# Patient Record
Sex: Male | Born: 1965 | Race: White | Hispanic: No | Smoking: Current every day smoker
Health system: Southern US, Community
[De-identification: ages and names within clinical notes are randomized; demographics above are authoritative.]

## PROBLEM LIST (undated history)

## (undated) DIAGNOSIS — Z86010 Personal history of colonic polyps: Secondary | ICD-10-CM

## (undated) DIAGNOSIS — I10 Essential (primary) hypertension: Secondary | ICD-10-CM

## (undated) DIAGNOSIS — E119 Type 2 diabetes mellitus without complications: Secondary | ICD-10-CM

## (undated) HISTORY — DX: Essential (primary) hypertension: I10

## (undated) HISTORY — DX: Personal history of colonic polyps: Z86.010

## (undated) HISTORY — DX: Type 2 diabetes mellitus without complications: E11.9

---
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
## 2002-03-13 HISTORY — PX: KNEE ARTHROSCOPY: SUR90

## 2013-04-21 DIAGNOSIS — E119 Type 2 diabetes mellitus without complications: Secondary | ICD-10-CM | POA: Insufficient documentation

## 2013-05-06 ENCOUNTER — Ambulatory Visit: Payer: Self-pay | Admitting: Internal Medicine

## 2013-05-11 ENCOUNTER — Ambulatory Visit: Payer: Self-pay | Admitting: Internal Medicine

## 2014-02-20 ENCOUNTER — Encounter: Payer: Self-pay | Admitting: Internal Medicine

## 2014-04-10 ENCOUNTER — Ambulatory Visit (AMBULATORY_SURGERY_CENTER): Payer: Self-pay

## 2014-04-10 VITALS — Ht 71.0 in | Wt 244.4 lb

## 2014-04-10 DIAGNOSIS — Z1211 Encounter for screening for malignant neoplasm of colon: Secondary | ICD-10-CM

## 2014-04-10 NOTE — Progress Notes (Signed)
No allergies to eggs or soy No past problems with anesthesia No home oxygen No diet/weight loss meds  No email  Emmi instructions given for colonoscopy

## 2014-04-13 LAB — HM COLONOSCOPY

## 2014-04-24 ENCOUNTER — Ambulatory Visit (AMBULATORY_SURGERY_CENTER): Payer: BLUE CROSS/BLUE SHIELD | Admitting: Internal Medicine

## 2014-04-24 ENCOUNTER — Encounter: Payer: Self-pay | Admitting: Internal Medicine

## 2014-04-24 VITALS — BP 111/91 | HR 65 | Temp 98.1°F | Resp 18 | Ht 71.0 in | Wt 244.0 lb

## 2014-04-24 DIAGNOSIS — D123 Benign neoplasm of transverse colon: Secondary | ICD-10-CM

## 2014-04-24 DIAGNOSIS — Z1211 Encounter for screening for malignant neoplasm of colon: Secondary | ICD-10-CM

## 2014-04-24 HISTORY — PX: COLONOSCOPY: SHX174

## 2014-04-24 MED ORDER — SODIUM CHLORIDE 0.9 % IV SOLN: 500.0000 mL | INTRAVENOUS | Status: DC

## 2014-04-24 NOTE — Progress Notes (Signed)
Called to room to assist during endoscopic procedure.  Patient ID and intended procedure confirmed with present staff. Received instructions for my participation in the procedure from the performing physician.  

## 2014-04-24 NOTE — Patient Instructions (Addendum)
I found and removed 2 small polyps that look benign.  I will let you know pathology results and when to have another routine colonoscopy by mail.  I appreciate the opportunity to care for you. Gatha Mayer, MD, FACG   YOU HAD AN ENDOSCOPIC PROCEDURE TODAY AT [facility]: Refer to the procedure report that was given to you for any specific questions about what was found during the examination.  If the procedure report does not answer your questions, please call your gastroenterologist to clarify.  If you requested that your care partner not be given the details of your procedure findings, then the procedure report has been included in a sealed envelope for you to review at your convenience later.  YOU SHOULD EXPECT: Some feelings of bloating in the abdomen. Passage of more gas than usual.  Walking can help get rid of the air that was put into your GI tract during the procedure and reduce the bloating. If you had a lower endoscopy (such as a colonoscopy or flexible sigmoidoscopy) you may notice spotting of blood in your stool or on the toilet paper. If you underwent a bowel prep for your procedure, then you may not have a normal bowel movement for a few days.  DIET: Your first meal following the procedure should be a light meal and then it is ok to progress to your normal diet.  A half-sandwich or bowl of soup is an example of a good first meal.  Heavy or fried foods are harder to digest and may make you feel nauseous or bloated.  Likewise meals heavy in dairy and vegetables can cause extra gas to form and this can also increase the bloating.  Drink plenty of fluids but you should avoid alcoholic beverages for 24 hours.  ACTIVITY: Your care partner should take you home directly after the procedure.  You should plan to take it easy, moving slowly for the rest of the day.  You can resume normal activity the day after the procedure however you should NOT DRIVE or use heavy  machinery for 24 hours (because of the sedation medicines used during the test).    SYMPTOMS TO REPORT IMMEDIATELY: A gastroenterologist can be reached at any hour.  During normal business hours, 8:30 AM to 5:00 PM Monday through Friday, call [phone].  After hours and on weekends, please call the GI answering service at [phone] who will take a message and have the physician on call contact you.   Following lower endoscopy (colonoscopy or flexible sigmoidoscopy):  Excessive amounts of blood in the stool  Significant tenderness or worsening of abdominal pains  Swelling of the abdomen that is new, acute  Fever of 100F or higher  FOLLOW UP: If any biopsies were taken you will be contacted by phone or by letter within the next 1-3 weeks.  Call your gastroenterologist if you have not heard about the biopsies in 3 weeks.  Our staff will call the home number listed on your records the next business day following your procedure to check on you and address any questions or concerns that you may have at that time regarding the information given to you following your procedure. This is a courtesy call and so if there is no answer at the home number and we have not heard from you through the emergency physician on call, we will assume that you have returned to your regular daily activities without incident.  SIGNATURES/CONFIDENTIALITY: You and/or your care  partner have signed paperwork which will be entered into your electronic medical record.  These signatures attest to the fact that that the information above on your After Visit Summary has been reviewed and is understood.  Full responsibility of the confidentiality of this discharge information lies with you and/or your care-partner.  Read all of the handouts given to you by your recovery room nurse.

## 2014-04-24 NOTE — Progress Notes (Signed)
Procedure ends, to recovery, report to Ernestine Conrad, RN. VSS

## 2014-04-24 NOTE — Op Note (Signed)
[employer]  [address] [place], [id number]   COLONOSCOPY PROCEDURE REPORT  PATIENT: Benjamin, Nelson  MR#: [id number] BIRTHDATE: 1965-03-16 , 48  yrs. old GENDER: male ENDOSCOPIST: Gatha Mayer, MD, [facility] PROCEDURE DATE:  04/24/2014 PROCEDURE:   Colonoscopy with snare polypectomy First Screening Colonoscopy - Avg.  risk and is 50 yrs.  old or older Yes.  Prior Negative Screening - Now for repeat screening. N/A  History of Adenoma - Now for follow-up colonoscopy & has been > or = to 3 yrs.  N/A  Polyps Removed Today? Yes. ASA CLASS:   Class II INDICATIONS:average risk patient for colorectal cancer. MEDICATIONS: Propofol 300 mg IV and Monitored anesthesia care  DESCRIPTION OF PROCEDURE:   After the risks benefits and alternatives of the procedure were thoroughly explained, informed consent was obtained.  The digital rectal exam revealed no abnormalities of the rectum, revealed no prostatic nodules, and revealed the prostate was not enlarged.   The [id number] endoscope was introduced through the anus and advanced to the cecum, which was identified by both the appendix and ileocecal valve. No adverse events experienced.   The quality of the prep was good, using MiraLax  The instrument was then slowly withdrawn as the colon was fully examined.      COLON FINDINGS: 1) Two diminutive transverse colon polyps removed with cold snare and sent to pathology. 2) Otherwise normal colonoscopy with good prep - first screening.  Retroflexed right colon and rectal views revealed no abnormalities. The time to cecum=2 minutes 02 seconds.  Withdrawal time=12 minutes 09 seconds. The scope was withdrawn and the procedure completed. COMPLICATIONS: There were no immediate complications.  ENDOSCOPIC IMPRESSION: 1) Two diminutive transverse colon polyps removed with cold snare and sent to pathology. 2) Otherwise normal colonoscopy with good prep - first  screening  RECOMMENDATIONS: Timing of repeat colonoscopy will be determined by pathology findings.  eSigned:  Gatha Mayer, MD, [facility] 04/24/2014 9:37 AM   cc: Halina Maidens, MD and The Patient

## 2014-04-27 ENCOUNTER — Telehealth: Payer: Self-pay | Admitting: *Deleted

## 2014-04-27 NOTE — Telephone Encounter (Signed)
  Follow up Call-  Call back number 04/24/2014  Post procedure Call Back phone  # [phone]  Permission to leave phone message Yes     No answer at # given.  Left message on VM.

## 2014-04-30 ENCOUNTER — Encounter: Payer: Self-pay | Admitting: Internal Medicine

## 2014-04-30 DIAGNOSIS — Z860100 Personal history of colon polyps, unspecified: Secondary | ICD-10-CM | POA: Insufficient documentation

## 2014-04-30 HISTORY — DX: Personal history of colonic polyps: Z86.010

## 2014-04-30 NOTE — Progress Notes (Signed)
Quick Note:  2 diminutive ssp's repeat colonoscopy 2021 ______

## 2014-08-05 LAB — LIPID PANEL
CHOLESTEROL: 200 mg/dL (ref 0–200)
HDL: 31 mg/dL — AB (ref 35–70)
LDL Cholesterol: 108 mg/dL
Triglycerides: 307 mg/dL — AB (ref 40–160)

## 2014-08-05 LAB — PSA: PSA: 1

## 2014-11-28 ENCOUNTER — Other Ambulatory Visit: Payer: Self-pay | Admitting: Internal Medicine

## 2014-12-29 ENCOUNTER — Encounter: Payer: Self-pay | Admitting: Internal Medicine

## 2014-12-29 ENCOUNTER — Other Ambulatory Visit: Payer: Self-pay | Admitting: Internal Medicine

## 2014-12-29 DIAGNOSIS — R7989 Other specified abnormal findings of blood chemistry: Secondary | ICD-10-CM | POA: Insufficient documentation

## 2014-12-29 DIAGNOSIS — R6882 Decreased libido: Secondary | ICD-10-CM | POA: Insufficient documentation

## 2014-12-29 DIAGNOSIS — I10 Essential (primary) hypertension: Secondary | ICD-10-CM | POA: Insufficient documentation

## 2014-12-30 ENCOUNTER — Encounter: Payer: Self-pay | Admitting: Internal Medicine

## 2014-12-30 ENCOUNTER — Ambulatory Visit (INDEPENDENT_AMBULATORY_CARE_PROVIDER_SITE_OTHER): Payer: BLUE CROSS/BLUE SHIELD | Admitting: Internal Medicine

## 2014-12-30 VITALS — BP 110/76 | HR 76 | Ht 71.0 in | Wt 242.2 lb

## 2014-12-30 DIAGNOSIS — Z23 Encounter for immunization: Secondary | ICD-10-CM | POA: Diagnosis not present

## 2014-12-30 DIAGNOSIS — I10 Essential (primary) hypertension: Secondary | ICD-10-CM

## 2014-12-30 DIAGNOSIS — E1169 Type 2 diabetes mellitus with other specified complication: Secondary | ICD-10-CM | POA: Diagnosis not present

## 2014-12-30 DIAGNOSIS — R7989 Other specified abnormal findings of blood chemistry: Secondary | ICD-10-CM

## 2014-12-30 DIAGNOSIS — E119 Type 2 diabetes mellitus without complications: Secondary | ICD-10-CM | POA: Diagnosis not present

## 2014-12-30 DIAGNOSIS — E291 Testicular hypofunction: Secondary | ICD-10-CM

## 2014-12-30 DIAGNOSIS — E785 Hyperlipidemia, unspecified: Secondary | ICD-10-CM

## 2014-12-30 MED ORDER — TESTOSTERONE 20.25 MG/ACT (1.62%) TD GEL: 4.0000 "application " | Freq: Every day | TRANSDERMAL | Status: DC

## 2014-12-30 NOTE — Progress Notes (Signed)
Date:  12/30/2014   Name:  Benjamin Nelson   DOB:  10/16/1965   MRN:  [id number]   Chief Complaint: Diabetes and Hyperlipidemia Diabetes He presents for his follow-up diabetic visit. He has type 2 diabetes mellitus. His disease course has been fluctuating. Pertinent negatives for hypoglycemia include no headaches. Pertinent negatives for diabetes include no chest pain and no fatigue. His home blood glucose trend is fluctuating dramatically. His breakfast blood glucose is taken between 7-8 am. His breakfast blood glucose range is generally 90-110 mg/dl. His dinner blood glucose is taken between 5-6 pm. His dinner blood glucose range is generally 140-180 mg/dl.  Hyperlipidemia This is a chronic problem. The current episode started more than 1 year ago. The problem is controlled. Recent lipid tests were reviewed and are normal. Exacerbating diseases include diabetes. Pertinent negatives include no chest pain or shortness of breath. Current antihyperlipidemic treatment includes statins. The current treatment provides significant improvement of lipids. There are no compliance problems.   Hypertension This is a chronic problem. The current episode started more than 1 year ago. The problem is unchanged. The problem is controlled. Pertinent negatives include no chest pain, headaches, palpitations or shortness of breath. Risk factors for coronary artery disease include diabetes mellitus and dyslipidemia. Past treatments include angiotensin blockers and diuretics. The current treatment provides significant improvement. There are no compliance problems.   Hypogonadism - patient was started on AndroGel about 8 months ago. Initially 2 applications per day and after the last visit increased to 4 applications per day for a level of 285.  He believes his energy level and libido are slightly improved but he could still feel better.   He has no issues with compliance and no local skin irritation.   Review of Systems   Constitutional: Negative for fever, appetite change and fatigue.  Eyes: Negative for visual disturbance.  Respiratory: Negative for chest tightness and shortness of breath.   Cardiovascular: Negative for chest pain, palpitations and leg swelling.  Gastrointestinal: Positive for diarrhea (intermittent). Negative for abdominal pain.  Genitourinary: Negative for difficulty urinating.  Skin: Negative for color change and rash.  Neurological: Negative for numbness and headaches.  Hematological: Negative for adenopathy.  Psychiatric/Behavioral: Negative for sleep disturbance and dysphoric mood.    Patient Active Problem List   Diagnosis Date Noted  . Essential (primary) hypertension 12/29/2014  . Decreased libido 12/29/2014  . Decreased testosterone level 12/29/2014  . Hx of colonic polyps - sessile serrated 04/30/2014  . Controlled diabetes mellitus type II without complication ([place]) 87/86/7672    Prior to Admission medications   Medication Sig Start Date End Date Taking? Authorizing Provider  atorvastatin (LIPITOR) 10 MG tablet Take 1 tablet by mouth daily. 08/05/14  Yes Historical Provider, MD  glucose blood (ONE TOUCH ULTRA TEST) test strip  05/14/13  Yes Historical Provider, MD  loratadine (CLARITIN) 10 MG tablet Take 10 mg by mouth daily.   Yes Historical Provider, MD  metFORMIN (GLUCOPHAGE-XR) 500 MG 24 hr tablet TAKE 1 TABLET EVERY DAY WITH FOOD 11/29/14  Yes Glean Hess, MD  Testosterone (ANDROGEL PUMP) 20.25 MG/ACT (1.62%) GEL Place 2 application onto the skin daily. 08/05/14  Yes Historical Provider, MD  valsartan (DIOVAN) 320 MG tablet Take 320 mg by mouth daily.   Yes Historical Provider, MD    No Known Allergies  Past Surgical History  Procedure Laterality Date  . Tonsillectomy    . Knee arthroscopy      left torn meniscus  Social History  Substance Use Topics  . Smoking status: Former Research scientist (life sciences)  . Smokeless tobacco: Never Used  . Alcohol Use: 1.8 oz/week    3  Glasses of wine per week     Medication list has been reviewed and updated.   Physical Exam  Constitutional: He is oriented to person, place, and time. He appears well-developed and well-nourished. No distress.  HENT:  Head: Normocephalic and atraumatic.  Eyes: Conjunctivae are normal. Right eye exhibits no discharge. Left eye exhibits no discharge. No scleral icterus.  Neck: Normal range of motion. Neck supple. No thyromegaly present.  Cardiovascular: Normal rate, regular rhythm and normal heart sounds.   Pulmonary/Chest: Effort normal and breath sounds normal. No respiratory distress.  Abdominal: Soft. Bowel sounds are normal. He exhibits no mass. There is no tenderness. There is no guarding.  Musculoskeletal: Normal range of motion. He exhibits no edema or tenderness.  Neurological: He is alert and oriented to person, place, and time.  Skin: Skin is warm and dry. No rash noted.  Psychiatric: He has a normal mood and affect. His behavior is normal. Thought content normal.  Nursing note and vitals reviewed.   BP 110/76 mmHg  Pulse 76  Ht 5\' 11"  (1.803 m)  Wt 242 lb 3.2 oz (109.861 kg)  BMI 33.79 kg/m2   Assessment and Plan: 1. Flu vaccine need - Flu Vaccine QUAD 36+ mos PF IM (Fluarix & Fluzone Quad PF)  2. Controlled type 2 diabetes mellitus without complication, without long-term current use of insulin (HCC) Fluctuating glucoses due to inconsistent dosing times for metformin He will work harder to take metformin every morning - Hemoglobin A1c - Comprehensive metabolic panel  3. Essential (primary) hypertension Controlled on medication - Comprehensive metabolic panel  4. Decreased testosterone level Now on 4 pump applications per day Patient advised to goal level of testosterone is 500 - Testosterone - Testosterone (ANDROGEL PUMP) 20.25 MG/ACT (1.62%) GEL; Place 4 application onto the skin daily.  Dispense: 75 g; Refill: 5  5. Hyperlipidemia associated with type 2  diabetes mellitus ([place]) Now on statin therapy without side effects CMP ordered  Halina Maidens, MD [facility]  12/30/2014

## 2014-12-31 LAB — COMPREHENSIVE METABOLIC PANEL
ALBUMIN: 4.3 g/dL (ref 3.5–5.5)
ALK PHOS: 85 IU/L (ref 39–117)
ALT: 35 IU/L (ref 0–44)
AST: 22 IU/L (ref 0–40)
Albumin/Globulin Ratio: 1.7 (ref 1.1–2.5)
BILIRUBIN TOTAL: 0.5 mg/dL (ref 0.0–1.2)
BUN / CREAT RATIO: 12 (ref 9–20)
BUN: 13 mg/dL (ref 6–24)
CHLORIDE: 97 mmol/L (ref 97–106)
CO2: 25 mmol/L (ref 18–29)
Calcium: 9.5 mg/dL (ref 8.7–10.2)
Creatinine, Ser: 1.06 mg/dL (ref 0.76–1.27)
GFR calc Af Amer: 93 mL/min/{1.73_m2} (ref >59)
GFR calc non Af Amer: 81 mL/min/{1.73_m2} (ref >59)
GLOBULIN, TOTAL: 2.5 g/dL (ref 1.5–4.5)
GLUCOSE: 136 mg/dL — AB (ref 65–99)
Potassium: 4.2 mmol/L (ref 3.5–5.2)
SODIUM: 138 mmol/L (ref 136–144)
Total Protein: 6.8 g/dL (ref 6.0–8.5)

## 2014-12-31 LAB — TESTOSTERONE: TESTOSTERONE: 381 ng/dL (ref 348–1197)

## 2014-12-31 LAB — HEMOGLOBIN A1C
ESTIMATED AVERAGE GLUCOSE: 171 mg/dL
HEMOGLOBIN A1C: 7.6 % — AB (ref 4.8–5.6)

## 2015-01-01 ENCOUNTER — Ambulatory Visit: Payer: Self-pay | Admitting: Internal Medicine

## 2015-01-18 ENCOUNTER — Other Ambulatory Visit: Payer: Self-pay

## 2015-01-18 MED ORDER — METFORMIN HCL ER 500 MG PO TB24: 500.0000 mg | ORAL_TABLET | Freq: Two times a day (BID) | ORAL | Status: DC

## 2015-01-31 ENCOUNTER — Other Ambulatory Visit: Payer: Self-pay | Admitting: Internal Medicine

## 2015-02-16 ENCOUNTER — Other Ambulatory Visit: Payer: Self-pay

## 2015-02-16 DIAGNOSIS — R7989 Other specified abnormal findings of blood chemistry: Secondary | ICD-10-CM

## 2015-02-16 MED ORDER — TESTOSTERONE 20.25 MG/ACT (1.62%) TD GEL: 4.0000 "application " | Freq: Every day | TRANSDERMAL | Status: DC

## 2015-03-17 ENCOUNTER — Other Ambulatory Visit: Payer: Self-pay | Admitting: Internal Medicine

## 2015-03-17 ENCOUNTER — Telehealth: Payer: Self-pay

## 2015-03-17 DIAGNOSIS — R7989 Other specified abnormal findings of blood chemistry: Secondary | ICD-10-CM

## 2015-03-17 MED ORDER — ATORVASTATIN CALCIUM 10 MG PO TABS: 10.0000 mg | ORAL_TABLET | Freq: Every day | ORAL | Status: DC

## 2015-03-17 NOTE — Telephone Encounter (Signed)
Patient called in today. States that he does not have any refills left on his Atorvastatin and also only received enough for a 15 day supply. Patient changed his pharmacy to [facility], which I have updated in the computer.

## 2015-03-18 ENCOUNTER — Other Ambulatory Visit: Payer: Self-pay | Admitting: Internal Medicine

## 2015-03-18 DIAGNOSIS — R7989 Other specified abnormal findings of blood chemistry: Secondary | ICD-10-CM

## 2015-03-18 MED ORDER — TESTOSTERONE 20.25 MG/ACT (1.62%) TD GEL: 4.0000 "application " | Freq: Every day | TRANSDERMAL | Status: DC

## 2015-04-08 ENCOUNTER — Other Ambulatory Visit: Payer: Self-pay | Admitting: Internal Medicine

## 2015-05-04 ENCOUNTER — Ambulatory Visit (INDEPENDENT_AMBULATORY_CARE_PROVIDER_SITE_OTHER): Payer: BLUE CROSS/BLUE SHIELD | Admitting: Internal Medicine

## 2015-05-04 ENCOUNTER — Encounter: Payer: Self-pay | Admitting: Internal Medicine

## 2015-05-04 VITALS — BP 122/76 | HR 76 | Ht 71.0 in | Wt 248.0 lb

## 2015-05-04 DIAGNOSIS — R7989 Other specified abnormal findings of blood chemistry: Secondary | ICD-10-CM

## 2015-05-04 DIAGNOSIS — I10 Essential (primary) hypertension: Secondary | ICD-10-CM | POA: Diagnosis not present

## 2015-05-04 DIAGNOSIS — E291 Testicular hypofunction: Secondary | ICD-10-CM | POA: Diagnosis not present

## 2015-05-04 DIAGNOSIS — E119 Type 2 diabetes mellitus without complications: Secondary | ICD-10-CM

## 2015-05-04 DIAGNOSIS — E118 Type 2 diabetes mellitus with unspecified complications: Secondary | ICD-10-CM | POA: Insufficient documentation

## 2015-05-04 MED ORDER — VALSARTAN 320 MG PO TABS: 320.0000 mg | ORAL_TABLET | Freq: Every day | ORAL | Status: DC

## 2015-05-04 NOTE — Progress Notes (Signed)
Date:  05/04/2015   Name:  Benjamin Nelson   DOB:  [birth year]   MRN:  [id number]   Chief Complaint: Follow-up; Diabetes; and Hypertension Diabetes He presents for his follow-up diabetic visit. He has type 2 diabetes mellitus. Pertinent negatives for hypoglycemia include no headaches or tremors. Pertinent negatives for diabetes include no chest pain, no fatigue, no polydipsia and no polyuria. Pertinent negatives for hypoglycemia complications include no nocturnal hypoglycemia and no required assistance. Symptoms are worsening. Current diabetic treatment includes oral agent (monotherapy) (instructed to increase metformin to bid).  Hypertension This is a chronic problem. The problem is unchanged. The problem is controlled. Pertinent negatives include no chest pain, headaches, palpitations or shortness of breath. Past treatments include angiotensin blockers. The current treatment provides significant improvement.   Low testosterone - patient reports doing well on testosterone replacement. He is using 4 pump applications per day. He has no side effects. He has better energy better concentration and better sleep. He denies any prostate obstructive symptoms.  Lab Results  Component Value Date   HGBA1C 7.6* 12/30/2014   Lab Results  Component Value Date   TESTOSTERONE 381 12/30/2014      Review of Systems  Constitutional: Negative for appetite change, fatigue and unexpected weight change.  Eyes: Negative for visual disturbance.  Respiratory: Negative for cough, shortness of breath and wheezing.   Cardiovascular: Negative for chest pain, palpitations and leg swelling.  Gastrointestinal: Negative for abdominal pain, diarrhea, constipation and blood in stool.  Endocrine: Negative for polydipsia and polyuria.  Genitourinary: Positive for difficulty urinating. Negative for dysuria, urgency, frequency and hematuria.  Skin: Negative for rash.  Neurological: Negative for tremors, numbness and headaches.     Patient Active Problem List   Diagnosis Date Noted  . Hyperlipidemia associated with type 2 diabetes mellitus ([place]) 12/30/2014  . Essential (primary) hypertension 12/29/2014  . Decreased libido 12/29/2014  . Decreased testosterone level 12/29/2014  . Hx of colonic polyps - sessile serrated 04/30/2014  . Controlled diabetes mellitus type II without complication ([place]) [id number]    Prior to Admission medications   Medication Sig Start Date End Date Taking? Authorizing Provider  atorvastatin (LIPITOR) 10 MG tablet Take 1 tablet (10 mg total) by mouth daily. 03/17/15  Yes Glean Hess, MD  glucose blood (ONE TOUCH ULTRA TEST) test strip  05/14/13  Yes Historical Provider, MD  loratadine (CLARITIN) 10 MG tablet Take 10 mg by mouth daily.   Yes Historical Provider, MD  metFORMIN (GLUCOPHAGE-XR) 500 MG 24 hr tablet TAKE 1 TABLET BY MOUTH EVERY DAY 01/31/15  Yes Glean Hess, MD  Testosterone (ANDROGEL PUMP) 20.25 MG/ACT (1.62%) GEL Place 4 application onto the skin daily. 03/18/15  Yes Glean Hess, MD  valsartan (DIOVAN) 320 MG tablet TAKE 1 TABLET BY MOUTH ONCE DAILY. 04/08/15  Yes Glean Hess, MD    No Known Allergies  Past Surgical History  Procedure Laterality Date  . Tonsillectomy    . Knee arthroscopy      left torn meniscus    Social History  Substance Use Topics  . Smoking status: Former Research scientist (life sciences)  . Smokeless tobacco: Never Used  . Alcohol Use: 1.8 oz/week    3 Glasses of wine per week     Comment: occasional     Medication list has been reviewed and updated.   Physical Exam  Constitutional: He is oriented to person, place, and time. He appears well-developed and well-nourished. No distress.  HENT:  Head: Normocephalic  and atraumatic.  Neck: Normal range of motion. Neck supple. Carotid bruit is not present. No thyromegaly present.  Cardiovascular: Normal rate, regular rhythm and normal heart sounds.   Pulmonary/Chest: Effort normal and breath sounds  normal. No respiratory distress.  Musculoskeletal: He exhibits no edema.  Neurological: He is alert and oriented to person, place, and time.  Skin: Skin is warm and dry. No rash noted.  Psychiatric: He has a normal mood and affect. His behavior is normal. Thought content normal.    BP 122/76 mmHg  Pulse 76  Ht 5\' 11"  (1.803 m)  Wt 248 lb (112.492 kg)  BMI 34.60 kg/m2  Assessment and Plan: 1. Essential (primary) hypertension controlled - valsartan (DIOVAN) 320 MG tablet; Take 1 tablet (320 mg total) by mouth daily.  Dispense: 30 tablet; Refill: 5  2. Controlled type 2 diabetes mellitus without complication, without long-term current use of insulin (HCC) Continue bid metformin - Lipid panel - Hemoglobin A1c  3. Decreased testosterone level Continue testosterone topical 4 pumps per day - PSA   Halina Maidens, MD [facility]  05/04/2015

## 2015-05-05 ENCOUNTER — Telehealth: Payer: Self-pay

## 2015-05-05 LAB — LIPID PANEL
Chol/HDL Ratio: 3.5 ratio units (ref 0.0–5.0)
Cholesterol, Total: 134 mg/dL (ref 100–199)
HDL: 38 mg/dL — AB (ref >39)
LDL Calculated: 72 mg/dL (ref 0–99)
Triglycerides: 121 mg/dL (ref 0–149)
VLDL Cholesterol Cal: 24 mg/dL (ref 5–40)

## 2015-05-05 LAB — HEMOGLOBIN A1C
ESTIMATED AVERAGE GLUCOSE: 166 mg/dL
HEMOGLOBIN A1C: 7.4 % — AB (ref 4.8–5.6)

## 2015-05-05 LAB — PSA: PROSTATE SPECIFIC AG, SERUM: 0.7 ng/mL (ref 0.0–4.0)

## 2015-05-05 NOTE — Telephone Encounter (Signed)
Left message for patient to call back  

## 2015-05-05 NOTE — Telephone Encounter (Signed)
-----   Message from Glean Hess, MD sent at 05/05/2015  7:51 AM EST ----- DM is better.  PSA and cholesterol remain normal.

## 2015-05-06 NOTE — Telephone Encounter (Signed)
Spoke with patient. Patient advised of all results and verbalized understanding. Will call back with any future questions or concerns. MAH  

## 2015-08-05 ENCOUNTER — Other Ambulatory Visit: Payer: Self-pay | Admitting: Internal Medicine

## 2015-08-17 ENCOUNTER — Encounter: Payer: BLUE CROSS/BLUE SHIELD | Admitting: Internal Medicine

## 2015-09-10 ENCOUNTER — Other Ambulatory Visit: Payer: Self-pay | Admitting: Internal Medicine

## 2015-09-16 NOTE — Telephone Encounter (Signed)
Patient scheduled DM follow up on 10/06/15 at 9 am

## 2015-09-21 ENCOUNTER — Ambulatory Visit (INDEPENDENT_AMBULATORY_CARE_PROVIDER_SITE_OTHER): Payer: BLUE CROSS/BLUE SHIELD | Admitting: Internal Medicine

## 2015-09-21 ENCOUNTER — Encounter: Payer: Self-pay | Admitting: Internal Medicine

## 2015-09-21 VITALS — BP 115/68 | HR 80 | Resp 16 | Ht 71.0 in | Wt 244.0 lb

## 2015-09-21 DIAGNOSIS — Z23 Encounter for immunization: Secondary | ICD-10-CM | POA: Diagnosis not present

## 2015-09-21 DIAGNOSIS — E119 Type 2 diabetes mellitus without complications: Secondary | ICD-10-CM

## 2015-09-21 DIAGNOSIS — Z0000 Encounter for general adult medical examination without abnormal findings: Secondary | ICD-10-CM | POA: Diagnosis not present

## 2015-09-21 DIAGNOSIS — E1169 Type 2 diabetes mellitus with other specified complication: Secondary | ICD-10-CM | POA: Diagnosis not present

## 2015-09-21 DIAGNOSIS — I10 Essential (primary) hypertension: Secondary | ICD-10-CM | POA: Diagnosis not present

## 2015-09-21 DIAGNOSIS — Z1159 Encounter for screening for other viral diseases: Secondary | ICD-10-CM

## 2015-09-21 DIAGNOSIS — E291 Testicular hypofunction: Secondary | ICD-10-CM | POA: Diagnosis not present

## 2015-09-21 DIAGNOSIS — E785 Hyperlipidemia, unspecified: Secondary | ICD-10-CM | POA: Diagnosis not present

## 2015-09-21 DIAGNOSIS — R7989 Other specified abnormal findings of blood chemistry: Secondary | ICD-10-CM

## 2015-09-21 DIAGNOSIS — J3089 Other allergic rhinitis: Secondary | ICD-10-CM | POA: Insufficient documentation

## 2015-09-21 MED ORDER — VALSARTAN 320 MG PO TABS: 320.0000 mg | ORAL_TABLET | Freq: Every day | ORAL | Status: DC

## 2015-09-21 MED ORDER — TESTOSTERONE 20.25 MG/ACT (1.62%) TD GEL: 4.0000 "application " | Freq: Every day | TRANSDERMAL | Status: DC

## 2015-09-21 MED ORDER — METFORMIN HCL ER 500 MG PO TB24: 500.0000 mg | ORAL_TABLET | Freq: Two times a day (BID) | ORAL | Status: DC

## 2015-09-21 NOTE — Progress Notes (Signed)
Date:  09/21/2015   Name:  Benjamin Nelson   DOB:  Mar 15, 1965   MRN:  [id number]   Chief Complaint: Annual Exam; Hypertension; and Diabetes [name] is a 50 y.o. male who presents today for his Complete Annual Exam. He feels well. He reports exercising walking. He reports he is sleeping well.   Hypertension This is a chronic problem. The current episode started more than 1 year ago. The problem is unchanged. The problem is controlled. Pertinent negatives include no chest pain, headaches, palpitations or shortness of breath. Past treatments include angiotensin blockers. The current treatment provides significant improvement.  Diabetes He presents for his follow-up diabetic visit. He has type 2 diabetes mellitus. His disease course has been stable. Pertinent negatives for hypoglycemia include no dizziness or headaches. Pertinent negatives for diabetes include no chest pain, no fatigue, no polydipsia and no polyuria. Current diabetic treatment includes oral agent (monotherapy). He is following a generally healthy diet. His breakfast blood glucose is taken between 7-8 am. His breakfast blood glucose range is generally 110-130 mg/dl. An ACE inhibitor/angiotensin II receptor blocker is being taken.  Hyperlipidemia This is a chronic problem. The current episode started more than 1 year ago. The problem is controlled. Pertinent negatives include no chest pain, myalgias or shortness of breath. Current antihyperlipidemic treatment includes statins.  Hypogonadism - on Androgel topical daily.  No side effects or cost issues.  He feels well.  Energy is good and he exercises daily.  He is disappointed that he has not lost any weight. Lab Results  Component Value Date   HGBA1C 7.4* 05/04/2015   Lab Results  Component Value Date   CREATININE 1.06 12/30/2014   Lab Results  Component Value Date   CHOL 134 05/04/2015   HDL 38* 05/04/2015   LDLCALC 72 05/04/2015   TRIG 121 05/04/2015   CHOLHDL 3.5 05/04/2015      Review of Systems  Constitutional: Negative for chills, diaphoresis, appetite change, fatigue and unexpected weight change.  HENT: Negative for hearing loss, tinnitus, trouble swallowing and voice change.   Eyes: Negative for visual disturbance.  Respiratory: Negative for choking, shortness of breath and wheezing.   Cardiovascular: Negative for chest pain, palpitations and leg swelling.  Gastrointestinal: Negative for abdominal pain, diarrhea, constipation and blood in stool.  Endocrine: Negative for polydipsia and polyuria.  Genitourinary: Negative for dysuria, frequency and difficulty urinating.  Musculoskeletal: Negative for myalgias, back pain and arthralgias.  Skin: Negative for color change and rash.  Neurological: Negative for dizziness, syncope, numbness and headaches.  Hematological: Negative for adenopathy.  Psychiatric/Behavioral: Negative for sleep disturbance and dysphoric mood.    Patient Active Problem List   Diagnosis Date Noted  . Environmental and seasonal allergies 09/21/2015  . Controlled type 2 diabetes mellitus without complication, without long-term current use of insulin ([place]) 05/04/2015  . Hyperlipidemia associated with type 2 diabetes mellitus ([place]) 12/30/2014  . Essential (primary) hypertension 12/29/2014  . Decreased libido 12/29/2014  . Decreased testosterone level 12/29/2014  . Hx of colonic polyps - sessile serrated 04/30/2014    Prior to Admission medications   Medication Sig Start Date End Date Taking? Authorizing Provider  atorvastatin (LIPITOR) 10 MG tablet Take 1 tablet (10 mg total) by mouth daily. 03/17/15  Yes Glean Hess, MD  glucose blood (ONE TOUCH ULTRA TEST) test strip  05/14/13  Yes Historical Provider, MD  loratadine (CLARITIN) 10 MG tablet Take 10 mg by mouth daily.   Yes Historical Provider, MD  metFORMIN (GLUCOPHAGE-XR) 500 MG 24 hr tablet TAKE 1 TABLET BY MOUTH TWICE DAILY 09/10/15  Yes Glean Hess, MD  Testosterone  (ANDROGEL PUMP) 20.25 MG/ACT (1.62%) GEL Place 4 application onto the skin daily. 03/18/15  Yes Glean Hess, MD  valsartan (DIOVAN) 320 MG tablet Take 1 tablet (320 mg total) by mouth daily. 05/04/15  Yes Glean Hess, MD    No Known Allergies  Past Surgical History  Procedure Laterality Date  . Tonsillectomy    . Knee arthroscopy      left torn meniscus  . Colonoscopy  [id number]    2 sessile serrated polyps    Social History  Substance Use Topics  . Smoking status: Former Research scientist (life sciences)  . Smokeless tobacco: Never Used  . Alcohol Use: 1.8 oz/week    3 Glasses of wine per week     Comment: occasional     Medication list has been reviewed and updated.   Physical Exam  Constitutional: He is oriented to person, place, and time. He appears well-developed and well-nourished.  HENT:  Head: Normocephalic.  Right Ear: Tympanic membrane, external ear and ear canal normal.  Left Ear: Tympanic membrane, external ear and ear canal normal.  Nose: Nose normal.  Mouth/Throat: Uvula is midline and oropharynx is clear and moist.  Eyes: Conjunctivae and EOM are normal. Pupils are equal, round, and reactive to light.  Neck: Normal range of motion. Neck supple. Carotid bruit is not present. No thyromegaly present.  Cardiovascular: Normal rate, regular rhythm, normal heart sounds and intact distal pulses.   Pulmonary/Chest: Effort normal and breath sounds normal. He has no wheezes. Right breast exhibits no mass. Left breast exhibits no mass.  Abdominal: Soft. Normal appearance and bowel sounds are normal. There is no hepatosplenomegaly. There is no tenderness.  Musculoskeletal: Normal range of motion. He exhibits no edema or tenderness.  Lymphadenopathy:    He has no cervical adenopathy.  Neurological: He is alert and oriented to person, place, and time. He has normal reflexes.  Foot exam - normal skin, nails, pulses and sensation bilaterally   Skin: Skin is warm, dry and intact.  Psychiatric:  He has a normal mood and affect. His speech is normal and behavior is normal. Judgment and thought content normal.  Nursing note and vitals reviewed.   BP 115/68 mmHg  Pulse 80  Resp 16  Ht 5\' 11"  (1.803 m)  Wt 244 lb (110.678 kg)  BMI 34.05 kg/m2  SpO2 97%  Assessment and Plan: 1. Annual physical exam Normal exam except for weight  2. Essential (primary) hypertension controlled - Comprehensive metabolic panel - valsartan (DIOVAN) 320 MG tablet; Take 1 tablet (320 mg total) by mouth daily.  Dispense: 30 tablet; Refill: 12  3. Controlled type 2 diabetes mellitus without complication, without long-term current use of insulin ([facility]) Continue oral agents; continue exercise and healthy diet - Hemoglobin A1c - metFORMIN (GLUCOPHAGE-XR) 500 MG 24 hr tablet; Take 1 tablet (500 mg total) by mouth 2 (two) times daily.  Dispense: 60 tablet; Refill: 12  4. Hyperlipidemia associated with type 2 diabetes mellitus ([place]) On statin therapy  Lipids done last visit  5. Decreased testosterone level PSA normal last visit - Testosterone - Testosterone (ANDROGEL PUMP) 20.25 MG/ACT (1.62%) GEL; Place 4 application onto the skin daily.  Dispense: 150 g; Refill: 5  6. Need for hepatitis C screening test Done at work ~2010  7. Need for pneumococcal vaccination - Pneumococcal polysaccharide vaccine 23-valent greater than or equal to  2yo subcutaneous/IM   Halina Maidens, MD [facility]  09/21/2015

## 2015-09-21 NOTE — Patient Instructions (Signed)
Pneumococcal Polysaccharide Vaccine: What You Need to Know  1. Why get vaccinated?  Vaccination can protect older adults (and some children and younger adults) from pneumococcal disease.  Pneumococcal disease is caused by bacteria that can spread from person to person through close contact. It can cause ear infections, and it can also lead to more serious infections of the:   · Lungs (pneumonia),  · Blood (bacteremia), and  · Covering of the brain and spinal cord (meningitis). Meningitis can cause deafness and brain damage, and it can be fatal.  Anyone can get pneumococcal disease, but children under 2 years of age, people with certain medical conditions, adults over 65 years of age, and cigarette smokers are at the highest risk.  About 18,000 older adults die each year from pneumococcal disease in the [place].  Treatment of pneumococcal infections with penicillin and other drugs used to be more effective. But some strains of the disease have become resistant to these drugs. This makes prevention of the disease, through vaccination, even more important.  2. Pneumococcal polysaccharide vaccine (PPSV23)  Pneumococcal polysaccharide vaccine (PPSV23) protects against 23 types of pneumococcal bacteria. It will not prevent all pneumococcal disease.  PPSV23 is recommended for:  · All adults 65 years of age and older,  · Anyone 2 through 52 years of age with certain long-term health problems,  · Anyone 2 through 52 years of age with a weakened immune system,  · Adults 19 through 52 years of age who smoke cigarettes or have asthma.  Most people need only one dose of PPSV. A second dose is recommended for certain high-risk groups. People 65 and older should get a dose even if they have gotten one or more doses of the vaccine before they turned 65.  Your healthcare provider can give you more information about these recommendations.  Most healthy adults develop protection within 2 to 3 weeks of getting the shot.  3. Some  people should not get this vaccine  · Anyone who has had a life-threatening allergic reaction to PPSV should not get another dose.  · Anyone who has a severe allergy to any component of PPSV should not receive it. Tell your provider if you have any severe allergies.  · Anyone who is moderately or severely ill when the shot is scheduled may be asked to wait until they recover before getting the vaccine. Someone with a mild illness can usually be vaccinated.  · Children less than 2 years of age should not receive this vaccine.  · There is no evidence that PPSV is harmful to either a pregnant woman or to her fetus. However, as a precaution, women who need the vaccine should be vaccinated before becoming pregnant, if possible.  4. Risks of a vaccine reaction  With any medicine, including vaccines, there is a chance of side effects. These are usually mild and go away on their own, but serious reactions are also possible.  About half of people who get PPSV have mild side effects, such as redness or pain where the shot is given, which go away within about two days.  Less than 1 out of 100 people develop a fever, muscle aches, or more severe local reactions.  Problems that could happen after any vaccine:  · People sometimes faint after a medical procedure, including vaccination. Sitting or lying down for about 15 minutes can help prevent fainting, and injuries caused by a fall. Tell your doctor if you feel dizzy, or have vision changes or   ringing in the ears.  · Some people get severe pain in the shoulder and have difficulty moving the arm where a shot was given. This happens very rarely.  · Any medication can cause a severe allergic reaction. Such reactions from a vaccine are very rare, estimated at about 1 in a million doses, and would happen within a few minutes to a few hours after the vaccination.  As with any medicine, there is a very remote chance of a vaccine causing a serious injury or death.  The safety of  vaccines is always being monitored. For more information, visit: www.cdc.gov/vaccinesafety/  5. What if there is a serious reaction?  What should I look for?  Look for anything that concerns you, such as signs of a severe allergic reaction, very high fever, or unusual behavior.   Signs of a severe allergic reaction can include hives, swelling of the face and throat, difficulty breathing, a fast heartbeat, dizziness, and weakness. These would usually start a few minutes to a few hours after the vaccination.  What should I do?  If you think it is a severe allergic reaction or other emergency that can't wait, call 9-1-1 or get to the nearest hospital. Otherwise, call your doctor.  Afterward, the reaction should be reported to the Vaccine Adverse Event Reporting System (VAERS). Your doctor might file this report, or you can do it yourself through the VAERS web site at www.vaers.hhs.gov, or by calling 1-800-822-7967.   VAERS does not give medical advice.  6. How can I learn more?  · Ask your doctor. He or she can give you the vaccine package insert or suggest other sources of information.  · Call your local or state health department.  · Contact the Centers for Disease Control and Prevention (CDC):    Call 1-800-232-4636 (1-800-CDC-INFO) or    Visit CDC's website at www.cdc.gov/vaccines  CDC Pneumococcal Polysaccharide Vaccine VIS (07/04/13)     This information is not intended to replace advice given to you by your health care provider. Make sure you discuss any questions you have with your health care provider.     Document Released: 12/25/2005 Document Revised: 03/20/2014 Document Reviewed: 07/07/2013  Elsevier Interactive Patient Education ©2016 Elsevier Inc.

## 2015-09-22 LAB — COMPREHENSIVE METABOLIC PANEL
ALBUMIN: 4.2 g/dL (ref 3.5–5.5)
ALT: 31 IU/L (ref 0–44)
AST: 25 IU/L (ref 0–40)
Albumin/Globulin Ratio: 1.7 (ref 1.2–2.2)
Alkaline Phosphatase: 82 IU/L (ref 39–117)
BUN / CREAT RATIO: 18 (ref 9–20)
BUN: 19 mg/dL (ref 6–24)
Bilirubin Total: 0.4 mg/dL (ref 0.0–1.2)
CALCIUM: 9.3 mg/dL (ref 8.7–10.2)
CHLORIDE: 98 mmol/L (ref 96–106)
CO2: 22 mmol/L (ref 18–29)
CREATININE: 1.07 mg/dL (ref 0.76–1.27)
GFR calc non Af Amer: 80 mL/min/{1.73_m2} (ref >59)
GFR, EST AFRICAN AMERICAN: 92 mL/min/{1.73_m2} (ref >59)
GLOBULIN, TOTAL: 2.5 g/dL (ref 1.5–4.5)
Glucose: 153 mg/dL — ABNORMAL HIGH (ref 65–99)
Potassium: 4.1 mmol/L (ref 3.5–5.2)
Sodium: 141 mmol/L (ref 134–144)
Total Protein: 6.7 g/dL (ref 6.0–8.5)

## 2015-09-22 LAB — HEMOGLOBIN A1C
Est. average glucose Bld gHb Est-mCnc: 151 mg/dL
Hgb A1c MFr Bld: 6.9 % — ABNORMAL HIGH (ref 4.8–5.6)

## 2015-09-22 LAB — TESTOSTERONE: TESTOSTERONE: 645 ng/dL (ref 348–1197)

## 2015-10-06 ENCOUNTER — Ambulatory Visit: Payer: BLUE CROSS/BLUE SHIELD | Admitting: Internal Medicine

## 2015-10-25 ENCOUNTER — Other Ambulatory Visit: Payer: Self-pay | Admitting: Internal Medicine

## 2015-10-25 ENCOUNTER — Ambulatory Visit (INDEPENDENT_AMBULATORY_CARE_PROVIDER_SITE_OTHER): Payer: BLUE CROSS/BLUE SHIELD

## 2015-10-25 DIAGNOSIS — Z23 Encounter for immunization: Secondary | ICD-10-CM | POA: Diagnosis not present

## 2015-10-25 DIAGNOSIS — R7989 Other specified abnormal findings of blood chemistry: Secondary | ICD-10-CM

## 2015-10-25 NOTE — Patient Instructions (Addendum)
Tuberculin Skin Test WHY AM I HAVING THIS TEST? Tuberculosis (TB) is a bacterial infection caused by Mycobacterium tuberculosis. Most people who are exposed to these bacteria have a strong enough defense (immune) system to prevent the bacteria from causing TB and developing symptoms. Their bodies prevent the germs from being active and making them sick (latent TB infection).  However, if you have TB germs in your body and your immune system is weak, you can develop a TB infection. This can cause symptoms such as:   Night sweats.  Fever.  Weakness.  Weight loss. A latent TB infection can also become active later in life if your immune system becomes weakened or compromised. You may have this test if your health care provider suspects that you have TB. You may also have this test to screen for TB if you are at risk for getting the disease. Those at increased risk include:  People who inject illegal drugs or share needles.  People with HIV or other diseases that affect immunity.  Health care workers.  People who live in high-risk communities, such as homeless shelters, nursing homes, and correctional facilities.  People who have been in contact with someone with TB.  People from countries where TB is more common. If you are in a high-risk group, your health care provider may wish to screen for TB more often. This can help prevent the spread of the disease. Sometimes TB screening is required when starting a new job, such as becoming a health care worker or a teacher. Colleges or universities may require it of new students. HOW WILL I BE TESTED? A tuberculin skin test is the main test used to check for exposure to the bacteria that can cause TB. The test checks for antibodies to the bacteria. Antibodies are proteins that your body produces to protect you from germs and other things that can make you sick. Your health care provider will inject a solution known as PPD (purified protein  derivative) under the first layer of skin on your arm. This causes a blister-like bubble to form at the site. Your health care provider will then examine the site after a number of hours have passed to see if a reaction has occurred. HOW DO I PREPARE FOR THE TEST? There is no preparation required for this test. WHAT DO THE RESULTS MEAN? Your test results will be reported as either negative or positive.  If the tuberculin skin test produces a negative result, it is likely that you do not have TB and have not been exposed to the TB bacteria. If you or your health care provider suspects exposure, however, you may want to repeat the test a few weeks later. A blood test may also be used to check for TB. This is because you will not react to the tuberculin skin test until several weeks after exposure to TB bacteria. If you test positive to the tuberculin skin test, it is likely that you have been exposed to TB bacteria. The test does not distinguish between an active and a latent TB infection. A false-positive result can occur. A false-positive result for TB bacteria is incorrect because it indicates a condition or finding is present when it is not. Talk to your health care provider to discuss your results, treatment options, and if necessary, the need for more tests. It is your responsibility to obtain your test results. Ask the lab or department performing the test when and how you will get your results. Talk with your   health care provider if you have any questions about your results.   This information is not intended to replace advice given to you by your health care provider. Make sure you discuss any questions you have with your health care provider.   Document Released: 12/07/2004 Document Revised: 03/20/2014 Document Reviewed: 06/23/2013 Elsevier Interactive Patient Education 2016 Elsevier Inc.  

## 2015-10-27 ENCOUNTER — Ambulatory Visit (INDEPENDENT_AMBULATORY_CARE_PROVIDER_SITE_OTHER): Payer: BLUE CROSS/BLUE SHIELD

## 2015-10-27 DIAGNOSIS — L539 Erythematous condition, unspecified: Secondary | ICD-10-CM

## 2015-10-27 LAB — TB SKIN TEST
Induration: 0 mm
TB Skin Test: NEGATIVE

## 2015-11-23 ENCOUNTER — Ambulatory Visit (INDEPENDENT_AMBULATORY_CARE_PROVIDER_SITE_OTHER): Payer: BLUE CROSS/BLUE SHIELD | Admitting: Internal Medicine

## 2015-11-23 ENCOUNTER — Encounter: Payer: Self-pay | Admitting: Internal Medicine

## 2015-11-23 VITALS — BP 117/82 | HR 74 | Resp 16 | Ht 71.0 in | Wt 254.0 lb

## 2015-11-23 DIAGNOSIS — E119 Type 2 diabetes mellitus without complications: Secondary | ICD-10-CM

## 2015-11-23 DIAGNOSIS — I10 Essential (primary) hypertension: Secondary | ICD-10-CM | POA: Diagnosis not present

## 2015-11-23 DIAGNOSIS — G478 Other sleep disorders: Secondary | ICD-10-CM

## 2015-11-23 DIAGNOSIS — G4730 Sleep apnea, unspecified: Secondary | ICD-10-CM

## 2015-11-23 NOTE — Progress Notes (Signed)
Date:  11/23/2015   Name:  Benjamin Nelson   DOB:  [birth year]   MRN:  [id number]   Chief Complaint: Sleep Apnea (Having Apnea spells x 12 months lasting 60 seconds at a time. He is not sleeping well avg 6-7 hours of interrupted sleep. Fatigue during day. Snoring. ) Morning fatigue, headache, sleepiness during the day.  BP has been controlled.  Diabetes  He presents for his follow-up diabetic visit. He has type 2 diabetes mellitus. His disease course has been stable. Pertinent negatives for hypoglycemia include no headaches or tremors. Associated symptoms include fatigue. Pertinent negatives for diabetes include no chest pain, no polydipsia and no polyuria.  Hypertension  This is a chronic problem. The current episode started more than 1 year ago. The problem is unchanged. The problem is controlled. Pertinent negatives include no chest pain, headaches, palpitations or shortness of breath.   Lab Results  Component Value Date   HGBA1C 6.9 (H) 09/21/2015     Review of Systems  Constitutional: Positive for fatigue. Negative for appetite change and unexpected weight change.  Eyes: Negative for visual disturbance.  Respiratory: Negative for cough, shortness of breath and wheezing.   Cardiovascular: Negative for chest pain, palpitations and leg swelling.  Gastrointestinal: Negative for abdominal pain and blood in stool.  Endocrine: Negative for polydipsia and polyuria.  Genitourinary: Negative for dysuria and hematuria.  Skin: Negative for color change and rash.  Neurological: Negative for tremors, numbness and headaches.  Psychiatric/Behavioral: Negative for dysphoric mood.    Patient Active Problem List   Diagnosis Date Noted  . Environmental and seasonal allergies 09/21/2015  . Controlled type 2 diabetes mellitus without complication, without long-term current use of insulin ([place]) 05/04/2015  . Hyperlipidemia associated with type 2 diabetes mellitus ([place]) 12/30/2014  . Essential  (primary) hypertension 12/29/2014  . Decreased libido 12/29/2014  . Decreased testosterone level 12/29/2014  . Hx of colonic polyps - sessile serrated 04/30/2014    Prior to Admission medications   Medication Sig Start Date End Date Taking? Authorizing Provider  ANDROGEL PUMP 20.25 MG/ACT (1.62%) GEL APPLY 4 PUMPS TOPICALLY DAILY AS DIRECTED. 10/25/15  Yes Glean Hess, MD  atorvastatin (LIPITOR) 10 MG tablet Take 1 tablet (10 mg total) by mouth daily. 03/17/15  Yes Glean Hess, MD  glucose blood (ONE TOUCH ULTRA TEST) test strip  05/14/13  Yes Historical Provider, MD  loratadine (CLARITIN) 10 MG tablet Take 10 mg by mouth daily.   Yes Historical Provider, MD  metFORMIN (GLUCOPHAGE-XR) 500 MG 24 hr tablet Take 1 tablet (500 mg total) by mouth 2 (two) times daily. 09/21/15  Yes Glean Hess, MD  valsartan (DIOVAN) 320 MG tablet Take 1 tablet (320 mg total) by mouth daily. 09/21/15  Yes Glean Hess, MD    No Known Allergies  Past Surgical History:  Procedure Laterality Date  . COLONOSCOPY  [id number]   2 sessile serrated polyps  . KNEE ARTHROSCOPY     left torn meniscus  . TONSILLECTOMY      Social History  Substance Use Topics  . Smoking status: Former Research scientist (life sciences)  . Smokeless tobacco: Never Used  . Alcohol use 1.8 oz/week    3 Glasses of wine per week     Comment: occasional     Medication list has been reviewed and updated.   Physical Exam  Constitutional: He is oriented to person, place, and time. He appears well-developed. No distress.  HENT:  Head: Normocephalic and atraumatic.  Neck:  Normal range of motion. Neck supple. Carotid bruit is not present.  Cardiovascular: Normal rate, regular rhythm and normal heart sounds.   Pulmonary/Chest: Effort normal and breath sounds normal. No respiratory distress.  Musculoskeletal: Normal range of motion.  Neurological: He is alert and oriented to person, place, and time.  Skin: Skin is warm and dry. No rash noted.    Psychiatric: He has a normal mood and affect. His behavior is normal. Thought content normal.  Nursing note and vitals reviewed.   BP 117/82 (BP Location: Right Arm, Patient Position: Sitting, Cuff Size: Large)   Pulse 74   Resp 16   Ht 5\' 11"  (1.803 m)   Wt 254 lb (115.2 kg)   SpO2 95%   BMI 35.43 kg/m   Assessment and Plan: 1. Sleep disorder breathing Highly suspect for sleep apnea  Patient instructed to call if not contacted within a week - Nocturnal polysomnography (NPSG); Future  2. Essential (primary) hypertension Controlled   3. Controlled type 2 diabetes mellitus without complication, without long-term current use of insulin ([place]) controlled   Halina Maidens, MD [facility]  11/23/2015

## 2015-12-03 ENCOUNTER — Ambulatory Visit: Payer: BLUE CROSS/BLUE SHIELD | Attending: Internal Medicine

## 2015-12-03 DIAGNOSIS — G4733 Obstructive sleep apnea (adult) (pediatric): Secondary | ICD-10-CM | POA: Diagnosis not present

## 2015-12-17 ENCOUNTER — Ambulatory Visit: Payer: BLUE CROSS/BLUE SHIELD | Attending: Internal Medicine

## 2015-12-17 DIAGNOSIS — G4733 Obstructive sleep apnea (adult) (pediatric): Secondary | ICD-10-CM | POA: Insufficient documentation

## 2016-01-13 ENCOUNTER — Encounter: Payer: BLUE CROSS/BLUE SHIELD | Admitting: Internal Medicine

## 2016-03-01 ENCOUNTER — Telehealth: Payer: Self-pay | Admitting: Internal Medicine

## 2016-03-03 NOTE — Telephone Encounter (Signed)
Error

## 2016-03-23 ENCOUNTER — Ambulatory Visit: Payer: BLUE CROSS/BLUE SHIELD | Admitting: Internal Medicine

## 2016-04-18 ENCOUNTER — Other Ambulatory Visit: Payer: Self-pay | Admitting: Internal Medicine

## 2016-04-18 MED ORDER — ATORVASTATIN CALCIUM 10 MG PO TABS
10.0000 mg | ORAL_TABLET | Freq: Every day | ORAL | 0 refills | Status: DC
Start: 2016-04-18 — End: 2016-04-24

## 2016-04-24 ENCOUNTER — Ambulatory Visit (INDEPENDENT_AMBULATORY_CARE_PROVIDER_SITE_OTHER): Payer: 59 | Admitting: Internal Medicine

## 2016-04-24 ENCOUNTER — Encounter: Payer: Self-pay | Admitting: Internal Medicine

## 2016-04-24 ENCOUNTER — Other Ambulatory Visit: Payer: Self-pay | Admitting: Internal Medicine

## 2016-04-24 VITALS — BP 122/92 | HR 96 | Temp 98.2°F | Ht 71.0 in | Wt 231.0 lb

## 2016-04-24 DIAGNOSIS — H1033 Unspecified acute conjunctivitis, bilateral: Secondary | ICD-10-CM

## 2016-04-24 DIAGNOSIS — E291 Testicular hypofunction: Secondary | ICD-10-CM

## 2016-04-24 DIAGNOSIS — I10 Essential (primary) hypertension: Secondary | ICD-10-CM

## 2016-04-24 DIAGNOSIS — E785 Hyperlipidemia, unspecified: Secondary | ICD-10-CM | POA: Diagnosis not present

## 2016-04-24 DIAGNOSIS — G4733 Obstructive sleep apnea (adult) (pediatric): Secondary | ICD-10-CM | POA: Insufficient documentation

## 2016-04-24 DIAGNOSIS — E1169 Type 2 diabetes mellitus with other specified complication: Secondary | ICD-10-CM | POA: Diagnosis not present

## 2016-04-24 DIAGNOSIS — E119 Type 2 diabetes mellitus without complications: Secondary | ICD-10-CM

## 2016-04-24 DIAGNOSIS — R05 Cough: Secondary | ICD-10-CM | POA: Diagnosis not present

## 2016-04-24 DIAGNOSIS — R059 Cough, unspecified: Secondary | ICD-10-CM

## 2016-04-24 DIAGNOSIS — J0100 Acute maxillary sinusitis, unspecified: Secondary | ICD-10-CM

## 2016-04-24 DIAGNOSIS — R7989 Other specified abnormal findings of blood chemistry: Secondary | ICD-10-CM

## 2016-04-24 MED ORDER — TESTOSTERONE 20.25 MG/ACT (1.62%) TD GEL
TRANSDERMAL | 5 refills | Status: DC
Start: 2016-04-24 — End: 2016-10-02

## 2016-04-24 MED ORDER — NEOMYCIN-POLYMYXIN-DEXAMETH 3.5-10000-0.1 OP SUSP: 2.0000 [drp] | Freq: Four times a day (QID) | OPHTHALMIC | 0 refills | Status: DC

## 2016-04-24 MED ORDER — AMOXICILLIN-POT CLAVULANATE 875-125 MG PO TABS: 1.0000 | ORAL_TABLET | Freq: Two times a day (BID) | ORAL | 0 refills | Status: DC

## 2016-04-24 MED ORDER — ATORVASTATIN CALCIUM 10 MG PO TABS: 10.0000 mg | ORAL_TABLET | Freq: Every day | ORAL | 5 refills | Status: DC

## 2016-04-24 NOTE — Progress Notes (Signed)
Date:  04/24/2016   Name:  Benjamin Nelson   DOB:  1965-12-18   MRN:  [id number]   Chief Complaint: Headache (Pt stated headache, sore throat, body ache, for about 1 month) Sore Throat   This is a recurrent problem. The current episode started 1 to 4 weeks ago. The problem has been waxing and waning. The maximum temperature recorded prior to his arrival was 100.4 - 100.9 F. The pain is mild. Associated symptoms include congestion, coughing, diarrhea, headaches, shortness of breath and vomiting. Pertinent negatives include no abdominal pain or ear pain. He has tried NSAIDs for the symptoms. The treatment provided mild relief.  Diabetes  He presents for his follow-up diabetic visit. He has type 2 diabetes mellitus. His disease course has been stable. Hypoglycemia symptoms include headaches. Pertinent negatives for hypoglycemia include no dizziness. Associated symptoms include fatigue. Pertinent negatives for diabetes include no chest pain. His breakfast blood glucose is taken between 6-7 am. His breakfast blood glucose range is generally 110-130 mg/dl.  Hypertension  This is a chronic problem. The current episode started more than 1 year ago. The problem is unchanged. The problem is controlled. Associated symptoms include headaches and shortness of breath. Pertinent negatives include no chest pain or palpitations.  Hyperlipidemia  This is a chronic problem. The problem is controlled. Associated symptoms include shortness of breath. Pertinent negatives include no chest pain. Current antihyperlipidemic treatment includes statins.  Low Testosterone - on Androgel and due for a refill.  He cancelled his last appointment so refill was not given previously.   Review of Systems  Constitutional: Positive for chills, fatigue and fever.  HENT: Positive for congestion, sinus pressure and sore throat. Negative for ear pain.   Respiratory: Positive for cough, chest tightness and shortness of breath. Negative for  wheezing.   Cardiovascular: Negative for chest pain, palpitations and leg swelling.  Gastrointestinal: Positive for diarrhea and vomiting. Negative for abdominal pain.  Musculoskeletal: Negative for arthralgias.  Neurological: Positive for headaches. Negative for dizziness and numbness.  Hematological: Negative for adenopathy. Does not bruise/bleed easily.  Psychiatric/Behavioral: Negative for dysphoric mood and sleep disturbance.    Patient Active Problem List   Diagnosis Date Noted  . OSA (obstructive sleep apnea) 04/24/2016  . Environmental and seasonal allergies 09/21/2015  . Controlled type 2 diabetes mellitus without complication, without long-term current use of insulin ([place]) 05/04/2015  . Hyperlipidemia associated with type 2 diabetes mellitus ([place]) 12/30/2014  . Essential (primary) hypertension 12/29/2014  . Decreased libido 12/29/2014  . Decreased testosterone level 12/29/2014  . Hx of colonic polyps - sessile serrated 04/30/2014    Prior to Admission medications   Medication Sig Start Date End Date Taking? Authorizing Provider  ANDROGEL PUMP 20.25 MG/ACT (1.62%) GEL APPLY 4 PUMPS TOPICALLY DAILY AS DIRECTED. 10/25/15  Yes Glean Hess, MD  atorvastatin (LIPITOR) 10 MG tablet Take 1 tablet (10 mg total) by mouth daily. 04/18/16  Yes Glean Hess, MD  glucose blood (ONE TOUCH ULTRA TEST) test strip  05/14/13  Yes Historical Provider, MD  loratadine (CLARITIN) 10 MG tablet Take 10 mg by mouth daily.   Yes Historical Provider, MD  metFORMIN (GLUCOPHAGE-XR) 500 MG 24 hr tablet Take 1 tablet (500 mg total) by mouth 2 (two) times daily. 09/21/15  Yes Glean Hess, MD  valsartan (DIOVAN) 320 MG tablet Take 1 tablet (320 mg total) by mouth daily. 09/21/15  Yes Glean Hess, MD    No Known Allergies  Past Surgical  History:  Procedure Laterality Date  . COLONOSCOPY  [id number]   2 sessile serrated polyps  . KNEE ARTHROSCOPY     left torn meniscus  . TONSILLECTOMY       Social History  Substance Use Topics  . Smoking status: Former Research scientist (life sciences)  . Smokeless tobacco: Never Used  . Alcohol use 1.8 oz/week    3 Glasses of wine per week     Comment: occasional     Medication list has been reviewed and updated.   Physical Exam  Constitutional: He is oriented to person, place, and time. He appears well-developed. No distress.  HENT:  Head: Normocephalic and atraumatic.  Right Ear: Ear canal normal. Tympanic membrane is retracted.  Left Ear: Ear canal normal. Tympanic membrane is retracted.  Nose: Right sinus exhibits maxillary sinus tenderness. Left sinus exhibits maxillary sinus tenderness.  Mouth/Throat: Posterior oropharyngeal edema and posterior oropharyngeal erythema present.  Eyes: EOM are normal. Pupils are equal, round, and reactive to light. Right conjunctiva is injected. Left conjunctiva is injected.  Neck: Normal range of motion. Neck supple.  Cardiovascular: Normal rate, regular rhythm and normal heart sounds.   Pulmonary/Chest: Effort normal. No respiratory distress. He has decreased breath sounds. He has no wheezes. He has no rhonchi.  Abdominal: Soft. Bowel sounds are normal.  Musculoskeletal: Normal range of motion.  Neurological: He is alert and oriented to person, place, and time.  Skin: Skin is warm and dry. No rash noted.  Psychiatric: He has a normal mood and affect. His behavior is normal. Thought content normal.  Nursing note and vitals reviewed.   BP (!) 122/92   Pulse 96   Temp 98.2 F (36.8 C)   Ht 5\' 11"  (1.803 m)   Wt 231 lb (104.8 kg)   SpO2 99%   BMI 32.22 kg/m   Assessment and Plan: 1. Cough - CBC with Differential/Platelet  2. Essential (primary) hypertension controlled  3. Controlled type 2 diabetes mellitus without complication, without long-term current use of insulin (HCC) Continue current therapy - Hemoglobin A1c  4. Hyperlipidemia associated with type 2 diabetes mellitus (HCC) On statins -  Comprehensive metabolic panel - atorvastatin (LIPITOR) 10 MG tablet; Take 1 tablet (10 mg total) by mouth daily.  Dispense: 30 tablet; Refill: 5  5. Decreased testosterone level - Testosterone, Free, Total, SHBG - Testosterone (ANDROGEL PUMP) 20.25 MG/ACT (1.62%) GEL; APPLY 4 PUMPS TOPICALLY DAILY AS DIRECTED.  Dispense: 150 g; Refill: 5  6. Acute maxillary sinusitis, recurrence not specified Continue Coricidin HBP - amoxicillin-clavulanate (AUGMENTIN) 875-125 MG tablet; Take 1 tablet by mouth 2 (two) times daily.  Dispense: 20 tablet; Refill: 0  7. Acute bacterial conjunctivitis of both eyes - neomycin-polymyxin b-dexamethasone (MAXITROL) 3.5-10000-0.1 SUSP; Place 2 drops into both eyes every 6 (six) hours.  Dispense: 5 mL; Refill: 0   Halina Maidens, MD [facility]  04/24/2016

## 2016-04-25 LAB — COMPREHENSIVE METABOLIC PANEL
A/G RATIO: 1.7 (ref 1.2–2.2)
ALT: 24 IU/L (ref 0–44)
AST: 13 IU/L (ref 0–40)
Albumin: 4.4 g/dL (ref 3.5–5.5)
Alkaline Phosphatase: 88 IU/L (ref 39–117)
BUN/Creatinine Ratio: 12 (ref 9–20)
BUN: 13 mg/dL (ref 6–24)
Bilirubin Total: 0.5 mg/dL (ref 0.0–1.2)
CALCIUM: 9.6 mg/dL (ref 8.7–10.2)
CO2: 27 mmol/L (ref 18–29)
Chloride: 98 mmol/L (ref 96–106)
Creatinine, Ser: 1.13 mg/dL (ref 0.76–1.27)
GFR, EST AFRICAN AMERICAN: 86 mL/min/{1.73_m2} (ref >59)
GFR, EST NON AFRICAN AMERICAN: 74 mL/min/{1.73_m2} (ref >59)
Globulin, Total: 2.6 g/dL (ref 1.5–4.5)
Glucose: 133 mg/dL — ABNORMAL HIGH (ref 65–99)
POTASSIUM: 4.4 mmol/L (ref 3.5–5.2)
Sodium: 140 mmol/L (ref 134–144)
Total Protein: 7 g/dL (ref 6.0–8.5)

## 2016-04-25 LAB — CBC WITH DIFFERENTIAL/PLATELET
BASOS ABS: 0 10*3/uL (ref 0.0–0.2)
Basos: 1 %
EOS (ABSOLUTE): 0.2 10*3/uL (ref 0.0–0.4)
Eos: 3 %
Hematocrit: 52.5 % — ABNORMAL HIGH (ref 37.5–51.0)
Hemoglobin: 17.6 g/dL (ref 13.0–17.7)
Immature Grans (Abs): 0 10*3/uL (ref 0.0–0.1)
Immature Granulocytes: 0 %
LYMPHS: 14 %
Lymphocytes Absolute: 0.8 10*3/uL (ref 0.7–3.1)
MCH: 29.8 pg (ref 26.6–33.0)
MCHC: 33.5 g/dL (ref 31.5–35.7)
MCV: 89 fL (ref 79–97)
Monocytes Absolute: 0.7 10*3/uL (ref 0.1–0.9)
Monocytes: 12 %
NEUTROS ABS: 4 10*3/uL (ref 1.4–7.0)
Neutrophils: 70 %
Platelets: 152 10*3/uL (ref 150–379)
RBC: 5.9 x10E6/uL — ABNORMAL HIGH (ref 4.14–5.80)
RDW: 13.5 % (ref 12.3–15.4)
WBC: 5.7 10*3/uL (ref 3.4–10.8)

## 2016-04-25 LAB — HEMOGLOBIN A1C
Est. average glucose Bld gHb Est-mCnc: 146 mg/dL
HEMOGLOBIN A1C: 6.7 % — AB (ref 4.8–5.6)

## 2016-04-25 LAB — TESTOSTERONE, FREE, TOTAL, SHBG
Sex Hormone Binding: 17.3 nmol/L — ABNORMAL LOW (ref 19.3–76.4)
Testosterone, Free: 5.6 pg/mL — ABNORMAL LOW (ref 7.2–24.0)
Testosterone: 50 ng/dL — ABNORMAL LOW (ref 264–916)

## 2016-04-27 ENCOUNTER — Telehealth: Payer: Self-pay

## 2016-04-27 NOTE — Telephone Encounter (Signed)
Pt called about lab results. Was told about good DM and low testosterone. Explained results were mailed out to him as well.

## 2016-06-08 ENCOUNTER — Other Ambulatory Visit: Payer: Self-pay

## 2016-06-08 DIAGNOSIS — I10 Essential (primary) hypertension: Secondary | ICD-10-CM

## 2016-06-08 DIAGNOSIS — E1169 Type 2 diabetes mellitus with other specified complication: Secondary | ICD-10-CM

## 2016-06-08 DIAGNOSIS — E119 Type 2 diabetes mellitus without complications: Secondary | ICD-10-CM

## 2016-06-08 DIAGNOSIS — E785 Hyperlipidemia, unspecified: Secondary | ICD-10-CM

## 2016-06-08 MED ORDER — VALSARTAN 320 MG PO TABS: 320.0000 mg | ORAL_TABLET | Freq: Every day | ORAL | 5 refills | Status: DC

## 2016-06-08 MED ORDER — ATORVASTATIN CALCIUM 10 MG PO TABS: 10.0000 mg | ORAL_TABLET | Freq: Every day | ORAL | 5 refills | Status: DC

## 2016-06-08 MED ORDER — METFORMIN HCL ER 500 MG PO TB24: 500.0000 mg | ORAL_TABLET | Freq: Two times a day (BID) | ORAL | 5 refills | Status: DC

## 2016-06-14 ENCOUNTER — Other Ambulatory Visit: Payer: Self-pay | Admitting: Internal Medicine

## 2016-06-14 DIAGNOSIS — E119 Type 2 diabetes mellitus without complications: Secondary | ICD-10-CM

## 2016-06-14 MED ORDER — METFORMIN HCL ER 500 MG PO TB24: 500.0000 mg | ORAL_TABLET | Freq: Two times a day (BID) | ORAL | 1 refills | Status: DC

## 2016-06-19 ENCOUNTER — Other Ambulatory Visit: Payer: Self-pay | Admitting: Internal Medicine

## 2016-06-19 DIAGNOSIS — I10 Essential (primary) hypertension: Secondary | ICD-10-CM

## 2016-06-19 DIAGNOSIS — E785 Hyperlipidemia, unspecified: Secondary | ICD-10-CM

## 2016-06-19 DIAGNOSIS — E119 Type 2 diabetes mellitus without complications: Secondary | ICD-10-CM

## 2016-06-19 DIAGNOSIS — E1169 Type 2 diabetes mellitus with other specified complication: Secondary | ICD-10-CM

## 2016-06-19 MED ORDER — ATORVASTATIN CALCIUM 10 MG PO TABS: 10.0000 mg | ORAL_TABLET | Freq: Every day | ORAL | 1 refills | Status: DC

## 2016-06-19 MED ORDER — METFORMIN HCL ER 500 MG PO TB24: 500.0000 mg | ORAL_TABLET | Freq: Two times a day (BID) | ORAL | 1 refills | Status: DC

## 2016-06-19 MED ORDER — VALSARTAN 320 MG PO TABS: 320.0000 mg | ORAL_TABLET | Freq: Every day | ORAL | 1 refills | Status: DC

## 2016-06-23 ENCOUNTER — Other Ambulatory Visit: Payer: Self-pay | Admitting: Internal Medicine

## 2016-06-23 ENCOUNTER — Ambulatory Visit (INDEPENDENT_AMBULATORY_CARE_PROVIDER_SITE_OTHER): Payer: 59 | Admitting: Internal Medicine

## 2016-06-23 ENCOUNTER — Encounter: Payer: Self-pay | Admitting: Internal Medicine

## 2016-06-23 VITALS — BP 122/88 | HR 80 | Temp 98.0°F | Ht 71.0 in | Wt 229.8 lb

## 2016-06-23 DIAGNOSIS — R1084 Generalized abdominal pain: Secondary | ICD-10-CM | POA: Diagnosis not present

## 2016-06-23 LAB — POCT URINALYSIS DIPSTICK
Bilirubin, UA: NEGATIVE
Blood, UA: NEGATIVE
GLUCOSE UA: NEGATIVE
KETONES UA: NEGATIVE
Leukocytes, UA: NEGATIVE
Nitrite, UA: NEGATIVE
PROTEIN UA: NEGATIVE
SPEC GRAV UA: 1.02 (ref 1.010–1.025)
UROBILINOGEN UA: 0.2 U/dL
pH, UA: 5 (ref 5.0–8.0)

## 2016-06-23 MED ORDER — DEXLANSOPRAZOLE 60 MG PO CPDR: 60.0000 mg | DELAYED_RELEASE_CAPSULE | Freq: Every day | ORAL | 0 refills | Status: DC

## 2016-06-23 NOTE — Patient Instructions (Signed)
Take Dexilant 60 mg once a day  [employer]

## 2016-06-23 NOTE — Progress Notes (Signed)
Date:  06/23/2016   Name:  Benjamin Nelson   DOB:  [birth year]   MRN:  [id number]   Chief Complaint: Abdominal Pain (Severe pain- pt feels full all the time. Pt touched right side and noticed bumps under skin. Rt side of ribs is sore. Pt has a extreme amount of flatulence and belching. Pt still has gallbladder and appendix. X 1 month but has gotten much worse. It hurts so bad to lay down. Right side of abdomin feels tough. Hasn't noticed fever. BM- having diarrhea stools. Having normal BM otherwise.  ) Abdominal Pain  This is a new problem. The current episode started in the past 7 days. The onset quality is sudden. The problem has been unchanged (intially had vomiting and diarrhea that resolved after 2 days). The pain is located in the generalized abdominal region. The quality of the pain is colicky, cramping and aching. The abdominal pain radiates to the right shoulder and chest. Associated symptoms include belching, flatus and nausea. Pertinent negatives include no constipation, diarrhea, dysuria, fever, headaches, hematochezia, hematuria or vomiting. The pain is aggravated by movement. The pain is relieved by nothing. He has tried antacids for the symptoms. The treatment provided no relief.    Review of Systems  Constitutional: Negative for fever.  Respiratory: Negative for choking, chest tightness, shortness of breath and wheezing.   Cardiovascular: Positive for chest pain. Negative for palpitations and leg swelling.  Gastrointestinal: Positive for abdominal distention, abdominal pain, flatus and nausea. Negative for blood in stool, constipation, diarrhea, hematochezia and vomiting.  Genitourinary: Negative for dysuria, hematuria and urgency.  Neurological: Negative for dizziness and headaches.    Patient Active Problem List   Diagnosis Date Noted  . OSA (obstructive sleep apnea) 04/24/2016  . Environmental and seasonal allergies 09/21/2015  . Controlled type 2 diabetes mellitus without  complication, without long-term current use of insulin ([place]) 05/04/2015  . Hyperlipidemia associated with type 2 diabetes mellitus ([place]) 12/30/2014  . Essential (primary) hypertension 12/29/2014  . Decreased libido 12/29/2014  . Decreased testosterone level 12/29/2014  . Hx of colonic polyps - sessile serrated 04/30/2014    Prior to Admission medications   Medication Sig Start Date End Date Taking? Authorizing Provider  atorvastatin (LIPITOR) 10 MG tablet Take 1 tablet (10 mg total) by mouth daily. 06/19/16  Yes Glean Hess, MD  glucose blood (ONE TOUCH ULTRA TEST) test strip  05/14/13  Yes Historical Provider, MD  loratadine (CLARITIN) 10 MG tablet Take 10 mg by mouth daily.   Yes Historical Provider, MD  metFORMIN (GLUCOPHAGE-XR) 500 MG 24 hr tablet Take 1 tablet (500 mg total) by mouth 2 (two) times daily. 06/19/16  Yes Glean Hess, MD  Testosterone (ANDROGEL PUMP) 20.25 MG/ACT (1.62%) GEL APPLY 4 PUMPS TOPICALLY DAILY AS DIRECTED. 04/24/16  Yes Glean Hess, MD  valsartan (DIOVAN) 320 MG tablet Take 1 tablet (320 mg total) by mouth daily. 06/19/16  Yes Glean Hess, MD    No Known Allergies  Past Surgical History:  Procedure Laterality Date  . COLONOSCOPY  [id number]   2 sessile serrated polyps  . KNEE ARTHROSCOPY     left torn meniscus  . TONSILLECTOMY      Social History  Substance Use Topics  . Smoking status: Former Research scientist (life sciences)  . Smokeless tobacco: Never Used  . Alcohol use 1.8 oz/week    3 Glasses of wine per week     Comment: occasional     Medication list has been reviewed  and updated.   Physical Exam  Constitutional: He appears well-developed. He appears distressed.  Neck: Normal range of motion. Neck supple.  Cardiovascular: Regular rhythm and normal heart sounds.   Pulmonary/Chest: Effort normal and breath sounds normal. No respiratory distress. He has no wheezes. He has no rales.  Abdominal: Normal appearance. He exhibits no distension. Bowel sounds are  decreased. There is generalized tenderness. There is CVA tenderness. There is no rigidity, no rebound and no guarding. No hernia.   Urine dipstick shows negative for all components.  Micro exam: not done.   BP 122/88 (BP Location: Right Arm, Patient Position: Sitting, Cuff Size: Large)   Pulse 80   Temp 98 F (36.7 C)   Ht 5\' 11"  (1.803 m)   Wt 229 lb 12.8 oz (104.2 kg)   SpO2 99%   BMI 32.05 kg/m   Assessment and Plan: 1. Generalized abdominal pain  Suspect Gall bladder disease Sample of dexilant [place] diet Go to ER if worsening - CBC with Differential/Platelet - US Abdomen Complete; Future - dexlansoprazole (DEXILANT) 60 MG capsule; Take 1 capsule (60 mg total) by mouth daily.  Dispense: 5 capsule; Refill: 0 - POCT urinalysis dipstick   Meds ordered this encounter  Medications  . dexlansoprazole (DEXILANT) 60 MG capsule    Sig: Take 1 capsule (60 mg total) by mouth daily.    Dispense:  5 capsule    Refill:  0    Halina Maidens, MD [facility]  06/23/2016

## 2016-06-24 LAB — CBC WITH DIFFERENTIAL/PLATELET
Basophils Absolute: 0 10*3/uL (ref 0.0–0.2)
Basos: 0 %
EOS (ABSOLUTE): 0.4 10*3/uL (ref 0.0–0.4)
Eos: 5 %
Hematocrit: 53.2 % — ABNORMAL HIGH (ref 37.5–51.0)
Hemoglobin: 17.5 g/dL (ref 13.0–17.7)
Immature Grans (Abs): 0 10*3/uL (ref 0.0–0.1)
Immature Granulocytes: 0 %
Lymphocytes Absolute: 1.7 10*3/uL (ref 0.7–3.1)
Lymphs: 24 %
MCH: 29.9 pg (ref 26.6–33.0)
MCHC: 32.9 g/dL (ref 31.5–35.7)
MCV: 91 fL (ref 79–97)
Monocytes Absolute: 0.5 10*3/uL (ref 0.1–0.9)
Monocytes: 6 %
Neutrophils Absolute: 4.7 10*3/uL (ref 1.4–7.0)
Neutrophils: 65 %
Platelets: 150 10*3/uL (ref 150–379)
RBC: 5.85 x10E6/uL — ABNORMAL HIGH (ref 4.14–5.80)
RDW: 15 % (ref 12.3–15.4)
WBC: 7.3 10*3/uL (ref 3.4–10.8)

## 2016-06-26 ENCOUNTER — Other Ambulatory Visit: Payer: Self-pay | Admitting: Internal Medicine

## 2016-06-26 ENCOUNTER — Ambulatory Visit
Admission: RE | Admit: 2016-06-26 | Discharge: 2016-06-26 | Disposition: A | Discharge status: Home / Self Care | Payer: No Typology Code available for payment source | Source: Ambulatory Visit | Attending: Internal Medicine | Admitting: Internal Medicine

## 2016-06-26 DIAGNOSIS — N289 Disorder of kidney and ureter, unspecified: Secondary | ICD-10-CM | POA: Diagnosis not present

## 2016-06-26 DIAGNOSIS — R1084 Generalized abdominal pain: Secondary | ICD-10-CM | POA: Insufficient documentation

## 2016-06-26 DIAGNOSIS — K8020 Calculus of gallbladder without cholecystitis without obstruction: Secondary | ICD-10-CM | POA: Diagnosis not present

## 2016-06-26 NOTE — Progress Notes (Signed)
Spoke to pt about [place]- multiple gall stones with no acute blockage. PT agrees that general surgery consultation. Would like this done asap to help with symptoms. Prefers [place].

## 2016-06-28 ENCOUNTER — Ambulatory Visit (INDEPENDENT_AMBULATORY_CARE_PROVIDER_SITE_OTHER): Payer: 59 | Admitting: General Surgery

## 2016-06-28 ENCOUNTER — Encounter: Payer: Self-pay | Admitting: General Surgery

## 2016-06-28 VITALS — BP 124/84 | HR 80 | Resp 12 | Ht 71.0 in | Wt 228.0 lb

## 2016-06-28 DIAGNOSIS — K8000 Calculus of gallbladder with acute cholecystitis without obstruction: Secondary | ICD-10-CM | POA: Diagnosis not present

## 2016-06-28 NOTE — Patient Instructions (Signed)
Laparoscopic Cholecystectomy Laparoscopic cholecystectomy is surgery to remove the gallbladder. The gallbladder is a pear-shaped organ that lies beneath the liver on the right side of the body. The gallbladder stores bile, which is a fluid that helps the body to digest fats. Cholecystectomy is often done for inflammation of the gallbladder (cholecystitis). This condition is usually caused by a buildup of gallstones (cholelithiasis) in the gallbladder. Gallstones can block the flow of bile, which can result in inflammation and pain. In severe cases, emergency surgery may be required. This procedure is done though small incisions in your abdomen (laparoscopic surgery). A thin scope with a camera (laparoscope) is inserted through one incision. Thin surgical instruments are inserted through the other incisions. In some cases, a laparoscopic procedure may be turned into a type of surgery that is done through a larger incision (open surgery). Tell a health care provider about:  Any allergies you have.  All medicines you are taking, including vitamins, herbs, eye drops, creams, and over-the-counter medicines.  Any problems you or family members have had with anesthetic medicines.  Any blood disorders you have.  Any surgeries you have had.  Any medical conditions you have.  Whether you are pregnant or may be pregnant. What are the risks? Generally, this is a safe procedure. However, problems may occur, including:  Infection.  Bleeding.  Allergic reactions to medicines.  Damage to other structures or organs.  A stone remaining in the common bile duct. The common bile duct carries bile from the gallbladder into the small intestine.  A bile leak from the cyst duct that is clipped when your gallbladder is removed. What happens before the procedure? Staying hydrated  Follow instructions from your health care provider about hydration, which may include:  Up to 2 hours before the procedure - you  may continue to drink clear liquids, such as water, clear fruit juice, black coffee, and plain tea. Eating and drinking restrictions  Follow instructions from your health care provider about eating and drinking, which may include:  8 hours before the procedure - stop eating heavy meals or foods such as meat, fried foods, or fatty foods.  6 hours before the procedure - stop eating light meals or foods, such as toast or cereal.  6 hours before the procedure - stop drinking milk or drinks that contain milk.  2 hours before the procedure - stop drinking clear liquids. Medicines   Ask your health care provider about:  Changing or stopping your regular medicines. This is especially important if you are taking diabetes medicines or blood thinners.  Taking medicines such as aspirin and ibuprofen. These medicines can thin your blood. Do not take these medicines before your procedure if your health care provider instructs you not to.  You may be given antibiotic medicine to help prevent infection. General instructions   Let your health care provider know if you develop a cold or an infection before surgery.  Plan to have someone take you home from the hospital or clinic.  Ask your health care provider how your surgical site will be marked or identified. What happens during the procedure?  To reduce your risk of infection:  Your health care team will wash or sanitize their hands.  Your skin will be washed with soap.  Hair may be removed from the surgical area.  An IV tube may be inserted into one of your veins.  You will be given one or more of the following:  A medicine to help you relax (  sedative).  A medicine to make you fall asleep (general anesthetic).  A breathing tube will be placed in your mouth.  Your surgeon will make several small cuts (incisions) in your abdomen.  The laparoscope will be inserted through one of the small incisions. The camera on the laparoscope will  send images to a TV screen (monitor) in the operating room. This lets your surgeon see inside your abdomen.  Air-like gas will be pumped into your abdomen. This will expand your abdomen to give the surgeon more room to perform the surgery.  Other tools that are needed for the procedure will be inserted through the other incisions. The gallbladder will be removed through one of the incisions.  Your common bile duct may be examined. If stones are found in the common bile duct, they may be removed.  After your gallbladder has been removed, the incisions will be closed with stitches (sutures), staples, or skin glue.  Your incisions may be covered with a bandage (dressing). The procedure may vary among health care providers and hospitals. What happens after the procedure?  Your blood pressure, heart rate, breathing rate, and blood oxygen level will be monitored until the medicines you were given have worn off.  You will be given medicines as needed to control your pain.  Do not drive for 24 hours if you were given a sedative. This information is not intended to replace advice given to you by your health care provider. Make sure you discuss any questions you have with your health care provider. Document Released: 02/27/2005 Document Revised: 09/19/2015 Document Reviewed: 08/16/2015 Elsevier Interactive Patient Education  2017 [employer]vier Inc.  

## 2016-06-28 NOTE — Progress Notes (Signed)
Patient ID: Benjamin Nelson, male   DOB: [birth year], 51 y.o.   MRN: [id number]  Chief Complaint  Patient presents with  . Abdominal Pain    HPI Benjamin Nelson is a 51 y.o. male here today for a evaluation of gallstone. Patient states he has been having pain on his right side for three weeks, which radiates to his lower back. He's had consistent nausea and belching with some vomiting and diarrhea. Decreased appetite.The right side is tender to touch. Moves his bowels daily, for the last week six or seven times a day. Had a ultrasound done on  06/26/2016.  HPI  Past Medical History:  Diagnosis Date  . Diabetes mellitus ([place])   . Hx of colonic polyps - sessile serrated 04/30/2014  . Hypertension     Past Surgical History:  Procedure Laterality Date  . COLONOSCOPY  [id number]   2 sessile serrated polyps  . KNEE ARTHROSCOPY Left 2004   left torn meniscus  . TONSILLECTOMY  1971    Family History  Problem Relation Age of Onset  . Ovarian cancer Mother   . Melanoma Father   . Diabetes Father   . Hypertension Father     Social History Social History  Substance Use Topics  . Smoking status: Former Research scientist (life sciences)  . Smokeless tobacco: Never Used  . Alcohol use 1.8 oz/week    3 Glasses of wine per week     Comment: occasional    No Known Allergies  Current Outpatient Prescriptions  Medication Sig Dispense Refill  . atorvastatin (LIPITOR) 10 MG tablet Take 1 tablet (10 mg total) by mouth daily. 90 tablet 1  . glucose blood (ONE TOUCH ULTRA TEST) test strip     . metFORMIN (GLUCOPHAGE-XR) 500 MG 24 hr tablet Take 1 tablet (500 mg total) by mouth 2 (two) times daily. 180 tablet 1  . Testosterone (ANDROGEL PUMP) 20.25 MG/ACT (1.62%) GEL APPLY 4 PUMPS TOPICALLY DAILY AS DIRECTED. 150 g 5  . valsartan (DIOVAN) 320 MG tablet Take 1 tablet (320 mg total) by mouth daily. 90 tablet 1  . dexlansoprazole (DEXILANT) 60 MG capsule Take 1 capsule (60 mg total) by mouth daily. 5 capsule 0  . loratadine (CLARITIN) 10  MG tablet Take 10 mg by mouth daily.     No current facility-administered medications for this visit.     Review of Systems Review of Systems  Constitutional: Negative.   Respiratory: Negative.   Cardiovascular: Negative.   Gastrointestinal: Positive for diarrhea, nausea and vomiting.    Blood pressure 124/84, pulse 80, resp. rate 12, height 5\' 11"  (1.803 m), weight 228 lb (103.4 kg).  Physical Exam Physical Exam  Constitutional: He is oriented to person, place, and time. He appears well-developed and well-nourished.  Eyes: Conjunctivae are normal. No scleral icterus.  Neck: Neck supple.  Cardiovascular: Normal rate, regular rhythm and normal heart sounds.   Pulmonary/Chest: Effort normal and breath sounds normal.  Abdominal: Soft. Normal appearance and bowel sounds are normal. There is tenderness in the right upper quadrant. There is positive Murphy's sign.  Lymphadenopathy:    He has no cervical adenopathy.  Neurological: He is alert and oriented to person, place, and time.  Skin: Skin is warm and dry.    Data Reviewed Prior notes reviewed Ultrasound reviewed, showed multiple gallstones but no evidence of acute cholecystitis. CBC was normal.   Assessment    Symptoms are consistent with biliary colic, likely due to cholelithiasis. Cholecystectomy scheduled for 07/03/2016. Discussed details with patient and  he is agreeable.     Plan    Ordered hepatic panel and lipase.  Laparoscopic Cholecystectomy with Intraoperative Cholangiogram. The procedure, including it's potential risks and complications (including but not limited to infection, bleeding, injury to intra-abdominal organs or bile ducts, bile leak, poor cosmetic result, sepsis and death) were discussed with the patient in detail. Non-operative options, including their inherent risks (acute calculous cholecystitis with possible choledocholithiasis or gallstone pancreatitis, with the risk of ascending cholangitis, sepsis,  and death) were discussed as well. The patient expressed and understanding of what we discussed and wishes to proceed with laparoscopic cholecystectomy. The patient further understands that if it is technically not possible, or it is unsafe to proceed laparoscopically, that I will convert to an open cholecystectomy. HPI, Physical Exam, Assessment and Plan have been scribed under the direction and in the presence of Mckinley Jewel, MD  Gaspar Cola, CMA      I have completed the exam and reviewed the above documentation for accuracy and completeness.  I agree with the above.  Haematologist has been used and any errors in dictation or transcription are unintentional.  Cutter Passey G. Jamal Collin, M.D., F.A.C.S.  Junie Panning G 06/28/2016, 3:37 PM  Patient's surgery has been scheduled for 07-03-16 at [facility]   Dominga Ferry, CMA

## 2016-06-29 ENCOUNTER — Telehealth: Payer: Self-pay | Admitting: *Deleted

## 2016-06-29 LAB — COMPREHENSIVE METABOLIC PANEL
ALT: 26 IU/L (ref 0–44)
AST: 19 IU/L (ref 0–40)
Albumin/Globulin Ratio: 1.7 (ref 1.2–2.2)
Albumin: 4.3 g/dL (ref 3.5–5.5)
Alkaline Phosphatase: 85 IU/L (ref 39–117)
BUN / CREAT RATIO: 15 (ref 9–20)
BUN: 16 mg/dL (ref 6–24)
Bilirubin Total: 0.4 mg/dL (ref 0.0–1.2)
CHLORIDE: 101 mmol/L (ref 96–106)
CO2: 28 mmol/L (ref 18–29)
CREATININE: 1.05 mg/dL (ref 0.76–1.27)
Calcium: 10 mg/dL (ref 8.7–10.2)
GFR calc non Af Amer: 81 mL/min/{1.73_m2} (ref >59)
GFR, EST AFRICAN AMERICAN: 94 mL/min/{1.73_m2} (ref >59)
Globulin, Total: 2.5 g/dL (ref 1.5–4.5)
Glucose: 117 mg/dL — ABNORMAL HIGH (ref 65–99)
Potassium: 4.7 mmol/L (ref 3.5–5.2)
Sodium: 141 mmol/L (ref 134–144)
TOTAL PROTEIN: 6.8 g/dL (ref 6.0–8.5)

## 2016-06-29 LAB — LIPASE: LIPASE: 28 U/L (ref 13–78)

## 2016-06-29 LAB — HEPATIC FUNCTION PANEL: Bilirubin, Direct: 0.14 mg/dL (ref 0.00–0.40)

## 2016-06-29 NOTE — Telephone Encounter (Signed)
Patient called to cancel surgery due to costs not covered by insurance. Patient will find surgeon closer in [place].

## 2016-06-30 ENCOUNTER — Inpatient Hospital Stay: Admission: RE | Admit: 2016-06-30 | Payer: No Typology Code available for payment source | Source: Ambulatory Visit

## 2016-07-03 ENCOUNTER — Encounter: Admission: RE | Payer: Self-pay | Source: Ambulatory Visit

## 2016-07-03 ENCOUNTER — Ambulatory Visit
Admission: RE | Admit: 2016-07-03 | Payer: No Typology Code available for payment source | Source: Ambulatory Visit | Admitting: General Surgery

## 2016-07-03 SURGERY — LAPAROSCOPIC CHOLECYSTECTOMY
Anesthesia: Choice

## 2016-07-04 HISTORY — PX: CHOLECYSTECTOMY, LAPAROSCOPIC: SHX56

## 2016-07-26 ENCOUNTER — Encounter: Payer: Self-pay | Admitting: Internal Medicine

## 2016-07-26 ENCOUNTER — Ambulatory Visit (INDEPENDENT_AMBULATORY_CARE_PROVIDER_SITE_OTHER): Payer: 59 | Admitting: Internal Medicine

## 2016-07-26 VITALS — BP 120/64 | HR 88 | Temp 97.9°F | Ht 71.0 in | Wt 229.0 lb

## 2016-07-26 DIAGNOSIS — J029 Acute pharyngitis, unspecified: Secondary | ICD-10-CM | POA: Diagnosis not present

## 2016-07-26 MED ORDER — AMOXICILLIN-POT CLAVULANATE 875-125 MG PO TABS: 1.0000 | ORAL_TABLET | Freq: Two times a day (BID) | ORAL | 0 refills | Status: DC

## 2016-07-26 NOTE — Progress Notes (Signed)
Date:  07/26/2016   Name:  Benjamin Nelson   DOB:  1965-06-03   MRN:  [id number]   Chief Complaint: Sore Throat (X 2 days. Hurts when swallowing. Voice is going out and in. Couldn't talk last night. Feels very tired. Had fever last night of 99.8.) Sore Throat   This is a new problem. The current episode started in the past 7 days. The problem has been gradually worsening. The maximum temperature recorded prior to his arrival was 100.4 - 100.9 F. The pain is moderate. Associated symptoms include a plugged ear sensation, swollen glands and trouble swallowing. Pertinent negatives include no coughing, diarrhea, ear discharge, headaches, shortness of breath or vomiting. He has had no exposure to strep. He has tried NSAIDs for the symptoms. The treatment provided mild relief.     Review of Systems  Constitutional: Positive for diaphoresis, fatigue and fever. Negative for chills.  HENT: Positive for postnasal drip, sore throat and trouble swallowing. Negative for ear discharge.   Eyes: Negative for visual disturbance.  Respiratory: Negative for cough, chest tightness and shortness of breath.   Cardiovascular: Negative for chest pain.  Gastrointestinal: Negative for diarrhea and vomiting.  Neurological: Negative for dizziness and headaches.    Patient Active Problem List   Diagnosis Date Noted  . OSA (obstructive sleep apnea) 04/24/2016  . Environmental and seasonal allergies 09/21/2015  . Controlled type 2 diabetes mellitus without complication, without long-term current use of insulin ([place]) 05/04/2015  . Hyperlipidemia associated with type 2 diabetes mellitus ([place]) 12/30/2014  . Essential (primary) hypertension 12/29/2014  . Decreased libido 12/29/2014  . Decreased testosterone level 12/29/2014  . Hx of colonic polyps - sessile serrated 04/30/2014    Prior to Admission medications   Medication Sig Start Date End Date Taking? Authorizing Provider  atorvastatin (LIPITOR) 10 MG tablet Take 1  tablet (10 mg total) by mouth daily. Patient taking differently: Take 10 mg by mouth at bedtime.  06/19/16  Yes Glean Hess, MD  dexlansoprazole (DEXILANT) 60 MG capsule Take 1 capsule (60 mg total) by mouth daily. 06/23/16  Yes Glean Hess, MD  glucose blood (ONE TOUCH ULTRA TEST) test strip  05/14/13  Yes [provider]  loratadine (CLARITIN) 10 MG tablet Take 10 mg by mouth daily.   Yes [provider]  metFORMIN (GLUCOPHAGE-XR) 500 MG 24 hr tablet Take 1 tablet (500 mg total) by mouth 2 (two) times daily. Patient taking differently: Take 1,000 mg by mouth daily with breakfast.  06/19/16  Yes Glean Hess, MD  Multiple Vitamins-Minerals (CENTRUM ADULTS PO) Take 1 tablet by mouth daily.   Yes [provider]  Testosterone (ANDROGEL PUMP) 20.25 MG/ACT (1.62%) GEL APPLY 4 PUMPS TOPICALLY DAILY AS DIRECTED. Patient taking differently: Apply 4 application topically daily. APPLY 2 PUMPS TOPICALLY TO EACH SHOULDER DAILY AS DIRECTED 04/24/16  Yes Glean Hess, MD  valsartan (DIOVAN) 320 MG tablet Take 1 tablet (320 mg total) by mouth daily. 06/19/16  Yes Glean Hess, MD    No Known Allergies  Past Surgical History:  Procedure Laterality Date  . COLONOSCOPY  [id number]   2 sessile serrated polyps  . KNEE ARTHROSCOPY Left 2004   left torn meniscus  . TONSILLECTOMY  1971    Social History  Substance Use Topics  . Smoking status: Former Research scientist (life sciences)  . Smokeless tobacco: Never Used  . Alcohol use 1.8 oz/week    3 Glasses of wine per week  Comment: occasional     Medication list has been reviewed and updated.   Physical Exam  Constitutional: He is oriented to person, place, and time. He appears well-developed. He has a sickly appearance. No distress.  HENT:  Head: Normocephalic and atraumatic.  Right Ear: Tympanic membrane is retracted. Tympanic membrane is not erythematous.  Left Ear: Tympanic membrane is erythematous and retracted.  Nose:  Right sinus exhibits no maxillary sinus tenderness and no frontal sinus tenderness. Left sinus exhibits no maxillary sinus tenderness and no frontal sinus tenderness.  Mouth/Throat: Posterior oropharyngeal edema and posterior oropharyngeal erythema present. No oropharyngeal exudate.  Cardiovascular: Normal rate and regular rhythm.   Pulmonary/Chest: Effort normal and breath sounds normal. No respiratory distress.  Musculoskeletal: Normal range of motion.  Neurological: He is alert and oriented to person, place, and time.  Skin: Skin is warm and dry. No rash noted.  Psychiatric: He has a normal mood and affect. His behavior is normal. Thought content normal.  Nursing note and vitals reviewed.   BP 120/64 (BP Location: Right Arm, Patient Position: Sitting, Cuff Size: Normal)   Pulse 88   Temp 97.9 F (36.6 C)   Ht 5\' 11"  (1.803 m)   Wt 229 lb (103.9 kg)   SpO2 97%   BMI 31.94 kg/m   Assessment and Plan: 1. Pharyngitis, unspecified etiology Continue lozenges, Advil or Tylenol   Meds ordered this encounter  Medications  . amoxicillin-clavulanate (AUGMENTIN) 875-125 MG tablet    Sig: Take 1 tablet by mouth 2 (two) times daily.    Dispense:  20 tablet    Refill:  0    Halina Maidens, MD [facility]  07/26/2016

## 2016-09-28 LAB — HM DIABETES EYE EXAM

## 2016-09-29 ENCOUNTER — Other Ambulatory Visit: Payer: Self-pay | Admitting: Internal Medicine

## 2016-09-29 ENCOUNTER — Telehealth: Payer: Self-pay

## 2016-09-29 MED ORDER — OLMESARTAN MEDOXOMIL 40 MG PO TABS: 40.0000 mg | ORAL_TABLET | Freq: Every day | ORAL | 0 refills | Status: DC

## 2016-09-29 NOTE — Telephone Encounter (Signed)
Pt called and left Vm stating needs new rx [facility] in to [facility] in [facility] on [address]. Please Advise. Pt said no need to call him back, he will just wait on [facility] for the call.

## 2016-10-02 ENCOUNTER — Encounter: Payer: Self-pay | Admitting: Internal Medicine

## 2016-10-02 ENCOUNTER — Ambulatory Visit (INDEPENDENT_AMBULATORY_CARE_PROVIDER_SITE_OTHER): Payer: 59 | Admitting: Internal Medicine

## 2016-10-02 VITALS — BP 120/76 | HR 61 | Ht 71.0 in | Wt 234.0 lb

## 2016-10-02 DIAGNOSIS — Z0000 Encounter for general adult medical examination without abnormal findings: Secondary | ICD-10-CM

## 2016-10-02 DIAGNOSIS — R7989 Other specified abnormal findings of blood chemistry: Secondary | ICD-10-CM

## 2016-10-02 DIAGNOSIS — G4733 Obstructive sleep apnea (adult) (pediatric): Secondary | ICD-10-CM

## 2016-10-02 DIAGNOSIS — E1169 Type 2 diabetes mellitus with other specified complication: Secondary | ICD-10-CM

## 2016-10-02 DIAGNOSIS — E785 Hyperlipidemia, unspecified: Secondary | ICD-10-CM

## 2016-10-02 DIAGNOSIS — Z125 Encounter for screening for malignant neoplasm of prostate: Secondary | ICD-10-CM

## 2016-10-02 DIAGNOSIS — I10 Essential (primary) hypertension: Secondary | ICD-10-CM

## 2016-10-02 DIAGNOSIS — E119 Type 2 diabetes mellitus without complications: Secondary | ICD-10-CM

## 2016-10-02 LAB — POCT URINALYSIS DIPSTICK
Bilirubin, UA: NEGATIVE
Blood, UA: NEGATIVE
Glucose, UA: NEGATIVE
KETONES UA: NEGATIVE
LEUKOCYTES UA: NEGATIVE
NITRITE UA: NEGATIVE
PH UA: 5 (ref 5.0–8.0)
Protein, UA: NEGATIVE
Spec Grav, UA: 1.01 (ref 1.010–1.025)
Urobilinogen, UA: 0.2 E.U./dL

## 2016-10-02 MED ORDER — TESTOSTERONE 20.25 MG/ACT (1.62%) TD GEL: TRANSDERMAL | 5 refills | Status: DC

## 2016-10-02 NOTE — Progress Notes (Signed)
Date:  10/02/2016   Name:  Benjamin Nelson   DOB:  20-Oct-1965   MRN:  [id number]   Chief Complaint: Annual Exam; Hypertension; Diabetes (BS- last week 119. ); and Hyperlipidemia Benjamin Nelson is a 51 y.o. male who presents today for his Complete Annual Exam. He feels well. He reports exercising regularly walking. He reports he is sleeping well on CPAP nightly.   Hypertension  This is a chronic problem. The problem is controlled. Pertinent negatives include no chest pain, headaches, palpitations or shortness of breath. Past treatments include angiotensin blockers (just changed to benicar from valsartan due to recall).  Diabetes  He presents for his follow-up diabetic visit. He has type 2 diabetes mellitus. Pertinent negatives for hypoglycemia include no dizziness or headaches. Pertinent negatives for diabetes include no chest pain and no fatigue. Current diabetic treatment includes oral agent (monotherapy). An ACE inhibitor/angiotensin II receptor blocker is being taken.  Hyperlipidemia  This is a chronic problem. Pertinent negatives include no chest pain, myalgias or shortness of breath. Current antihyperlipidemic treatment includes statins.  Testosterone - on supplementation with no side effects.  Has good energy, sleeps well, no urinary issues.  Lab Results  Component Value Date   HGBA1C 6.7 (H) 04/24/2016     Review of Systems  Constitutional: Negative for appetite change, chills, diaphoresis, fatigue and unexpected weight change.  HENT: Negative for hearing loss, tinnitus, trouble swallowing and voice change.   Eyes: Negative for visual disturbance.  Respiratory: Negative for choking, shortness of breath and wheezing.   Cardiovascular: Negative for chest pain, palpitations and leg swelling.  Gastrointestinal: Negative for abdominal pain, blood in stool, constipation and diarrhea.  Genitourinary: Negative for decreased urine volume, difficulty urinating, dysuria, frequency, hematuria and  urgency.  Musculoskeletal: Negative for arthralgias, back pain and myalgias.  Skin: Negative for color change and rash.  Neurological: Negative for dizziness, syncope and headaches.  Hematological: Negative for adenopathy.  Psychiatric/Behavioral: Negative for dysphoric mood and sleep disturbance.    Patient Active Problem List   Diagnosis Date Noted  . OSA (obstructive sleep apnea) 04/24/2016  . Environmental and seasonal allergies 09/21/2015  . Controlled type 2 diabetes mellitus without complication, without long-term current use of insulin ([place]) 05/04/2015  . Hyperlipidemia associated with type 2 diabetes mellitus ([facility]) 12/30/2014  . Essential (primary) hypertension 12/29/2014  . Decreased libido 12/29/2014  . Decreased testosterone level 12/29/2014  . Hx of colonic polyps - sessile serrated 04/30/2014    Prior to Admission medications   Medication Sig Start Date End Date Taking? Authorizing Provider  amoxicillin-clavulanate (AUGMENTIN) 875-125 MG tablet Take 1 tablet by mouth 2 (two) times daily. 07/26/16   Glean Hess, MD  atorvastatin (LIPITOR) 10 MG tablet Take 1 tablet (10 mg total) by mouth daily. Patient taking differently: Take 10 mg by mouth at bedtime.  06/19/16   Glean Hess, MD  dexlansoprazole (DEXILANT) 60 MG capsule Take 1 capsule (60 mg total) by mouth daily. 06/23/16   Glean Hess, MD  glucose blood (ONE TOUCH ULTRA TEST) test strip  05/14/13   [provider]  loratadine (CLARITIN) 10 MG tablet Take 10 mg by mouth daily.    [provider]  metFORMIN (GLUCOPHAGE-XR) 500 MG 24 hr tablet Take 1 tablet (500 mg total) by mouth 2 (two) times daily. Patient taking differently: Take 1,000 mg by mouth daily with breakfast.  06/19/16   Glean Hess, MD  Multiple Vitamins-Minerals (CENTRUM ADULTS PO) Take 1 tablet  by mouth daily.    [provider]  olmesartan (BENICAR) 40 MG tablet Take 1 tablet (40 mg total) by mouth daily.  09/29/16   Glean Hess, MD  Testosterone (ANDROGEL PUMP) 20.25 MG/ACT (1.62%) GEL APPLY 4 PUMPS TOPICALLY DAILY AS DIRECTED. Patient taking differently: Apply 4 application topically daily. APPLY 2 PUMPS TOPICALLY TO EACH SHOULDER DAILY AS DIRECTED 04/24/16   Glean Hess, MD    No Known Allergies  Past Surgical History:  Procedure Laterality Date  . CHOLECYSTECTOMY, LAPAROSCOPIC  07/04/2016   [place]  . COLONOSCOPY  [id number]   2 sessile serrated polyps  . KNEE ARTHROSCOPY Left 2004   left torn meniscus  . TONSILLECTOMY  1971    Social History  Substance Use Topics  . Smoking status: Former Research scientist (life sciences)  . Smokeless tobacco: Never Used  . Alcohol use 1.8 oz/week    3 Glasses of wine per week     Comment: occasional     Medication list has been reviewed and updated.   Physical Exam  Constitutional: He is oriented to person, place, and time. He appears well-developed and well-nourished.  HENT:  Head: Normocephalic.  Right Ear: Tympanic membrane, external ear and ear canal normal.  Left Ear: Tympanic membrane, external ear and ear canal normal.  Nose: Nose normal.  Mouth/Throat: Uvula is midline and oropharynx is clear and moist.  Eyes: Pupils are equal, round, and reactive to light. Conjunctivae and EOM are normal.  Neck: Normal range of motion. Neck supple. Carotid bruit is not present. No thyromegaly present.  Cardiovascular: Normal rate, regular rhythm, normal heart sounds and intact distal pulses.   Pulmonary/Chest: Effort normal and breath sounds normal. He has no wheezes. Right breast exhibits no mass. Left breast exhibits no mass.  Abdominal: Soft. Normal appearance and bowel sounds are normal. There is no hepatosplenomegaly. There is no tenderness.  Musculoskeletal: Normal range of motion.  Lymphadenopathy:    He has no cervical adenopathy.  Neurological: He is alert and oriented to person, place, and time. He has normal strength and normal reflexes. No  sensory deficit.  Skin: Skin is warm, dry and intact.  Psychiatric: He has a normal mood and affect. His speech is normal and behavior is normal. Judgment and thought content normal.  Nursing note and vitals reviewed.   BP 120/76   Pulse 61   Ht 5\' 11"  (1.803 m)   Wt 234 lb (106.1 kg)   SpO2 98%   BMI 32.64 kg/m   Assessment and Plan: 1. Annual physical exam Continue healthy diet and exercise - POCT urinalysis dipstick  2. Essential (primary) hypertension Controlled on olmesartan - CBC with Differential/Platelet - Comprehensive metabolic panel  3. Controlled type 2 diabetes mellitus without complication, without long-term current use of insulin (HCC) stable - Hemoglobin A1c - Microalbumin / creatinine urine ratio  4. Hyperlipidemia associated with type 2 diabetes mellitus ([place]) On statin therapy - Lipid panel  5. Prostate cancer screening Due to testosterone therapy - PSA  6. Decreased testosterone level Continue supplementation - Testosterone (ANDROGEL PUMP) 20.25 MG/ACT (1.62%) GEL; APPLY 4 PUMPS TOPICALLY DAILY AS DIRECTED.  Dispense: 150 g; Refill: 5 - Testosterone  7. OSA (obstructive sleep apnea) Doing well on CPAP   Meds ordered this encounter  Medications  . Testosterone (ANDROGEL PUMP) 20.25 MG/ACT (1.62%) GEL    Sig: APPLY 4 PUMPS TOPICALLY DAILY AS DIRECTED.    Dispense:  150 g    Refill:  5    Mickel Baas  Army Melia, [facility]  10/02/2016

## 2016-10-03 LAB — CBC WITH DIFFERENTIAL/PLATELET
BASOS ABS: 0 10*3/uL (ref 0.0–0.2)
Basos: 0 %
EOS (ABSOLUTE): 0.2 10*3/uL (ref 0.0–0.4)
Eos: 3 %
Hematocrit: 48.3 % (ref 37.5–51.0)
Hemoglobin: 16.7 g/dL (ref 13.0–17.7)
IMMATURE GRANS (ABS): 0 10*3/uL (ref 0.0–0.1)
IMMATURE GRANULOCYTES: 0 %
LYMPHS: 24 %
Lymphocytes Absolute: 1.6 10*3/uL (ref 0.7–3.1)
MCH: 30.3 pg (ref 26.6–33.0)
MCHC: 34.6 g/dL (ref 31.5–35.7)
MCV: 88 fL (ref 79–97)
Monocytes Absolute: 0.6 10*3/uL (ref 0.1–0.9)
Monocytes: 9 %
NEUTROS ABS: 4.4 10*3/uL (ref 1.4–7.0)
NEUTROS PCT: 64 %
PLATELETS: 141 10*3/uL — AB (ref 150–379)
RBC: 5.52 x10E6/uL (ref 4.14–5.80)
RDW: 13.6 % (ref 12.3–15.4)
WBC: 6.9 10*3/uL (ref 3.4–10.8)

## 2016-10-03 LAB — COMPREHENSIVE METABOLIC PANEL
ALT: 17 IU/L (ref 0–44)
AST: 19 IU/L (ref 0–40)
Albumin/Globulin Ratio: 2 (ref 1.2–2.2)
Albumin: 4.5 g/dL (ref 3.5–5.5)
Alkaline Phosphatase: 81 IU/L (ref 39–117)
BILIRUBIN TOTAL: 0.4 mg/dL (ref 0.0–1.2)
BUN/Creatinine Ratio: 17 (ref 9–20)
BUN: 17 mg/dL (ref 6–24)
CHLORIDE: 101 mmol/L (ref 96–106)
CO2: 24 mmol/L (ref 20–29)
CREATININE: 0.98 mg/dL (ref 0.76–1.27)
Calcium: 9.4 mg/dL (ref 8.7–10.2)
GFR calc non Af Amer: 88 mL/min/{1.73_m2} (ref >59)
GFR, EST AFRICAN AMERICAN: 102 mL/min/{1.73_m2} (ref >59)
GLUCOSE: 121 mg/dL — AB (ref 65–99)
Globulin, Total: 2.2 g/dL (ref 1.5–4.5)
Potassium: 4 mmol/L (ref 3.5–5.2)
Sodium: 142 mmol/L (ref 134–144)
TOTAL PROTEIN: 6.7 g/dL (ref 6.0–8.5)

## 2016-10-03 LAB — LIPID PANEL
CHOLESTEROL TOTAL: 157 mg/dL (ref 100–199)
Chol/HDL Ratio: 4 ratio (ref 0.0–5.0)
HDL: 39 mg/dL — AB (ref >39)
LDL Calculated: 101 mg/dL — ABNORMAL HIGH (ref 0–99)
TRIGLYCERIDES: 87 mg/dL (ref 0–149)
VLDL CHOLESTEROL CAL: 17 mg/dL (ref 5–40)

## 2016-10-03 LAB — TESTOSTERONE: Testosterone: 453 ng/dL (ref 264–916)

## 2016-10-03 LAB — HEMOGLOBIN A1C
ESTIMATED AVERAGE GLUCOSE: 146 mg/dL
Hgb A1c MFr Bld: 6.7 % — ABNORMAL HIGH (ref 4.8–5.6)

## 2016-10-03 LAB — PSA: Prostate Specific Ag, Serum: 1.1 ng/mL (ref 0.0–4.0)

## 2016-10-13 ENCOUNTER — Telehealth: Payer: Self-pay

## 2016-10-13 NOTE — Telephone Encounter (Signed)
Called and left pt VM informing to stop taking BENICAR and call Monday so we can consult with Dr Army Melia to find out what medication he needs to be taking.

## 2016-10-13 NOTE — Telephone Encounter (Signed)
Patient called stating he picked up medication- BENICAR- and started taking it. Had a allergic reaction- throat swelling, hives- he took benadryl and symptoms stopped and went away. Stated he believes it is the medicine and would like alternative sent into pharmacy. Please Advise.

## 2016-10-13 NOTE — Telephone Encounter (Signed)
If he had allergic reaction to this medication, he may have class wide allergy. I would advise him to wait and consult with Dr. Army Melia to see if she wants to switch classes and what drug she prefers. Thanks.

## 2016-10-19 ENCOUNTER — Telehealth: Payer: Self-pay

## 2016-10-19 ENCOUNTER — Other Ambulatory Visit: Payer: Self-pay | Admitting: Internal Medicine

## 2016-10-19 MED ORDER — IRBESARTAN 300 MG PO TABS: 300.0000 mg | ORAL_TABLET | Freq: Every day | ORAL | 5 refills | Status: DC

## 2016-10-19 NOTE — Telephone Encounter (Signed)
Patient informed of medication change. Told call if any issues with it.

## 2016-10-19 NOTE — Telephone Encounter (Signed)
Since he was able to take Valsartan but had reaction to Benicar (even though its the same class of medications) I would recommend another medication in the same class if he is willing.  Irbesartan 300 mg will be sent to pharmacy.

## 2016-10-19 NOTE — Telephone Encounter (Signed)
Patient called while Dr Army Melia was on vacation regarding BENICAR allergic reaction- HIVES, coughing. Stopped taking Benicar and took Benadryl- all symptoms stopped. Adriana told me to tell pt to call when Dr Army Melia is back to consult with her about medication change. Please advise- telephone messages are in chart for Adriana note.

## 2016-11-22 ENCOUNTER — Encounter: Payer: Self-pay | Admitting: Internal Medicine

## 2016-12-25 ENCOUNTER — Other Ambulatory Visit: Payer: Self-pay | Admitting: Internal Medicine

## 2016-12-25 DIAGNOSIS — E119 Type 2 diabetes mellitus without complications: Secondary | ICD-10-CM

## 2016-12-26 ENCOUNTER — Other Ambulatory Visit: Payer: Self-pay | Admitting: Internal Medicine

## 2017-02-06 ENCOUNTER — Ambulatory Visit: Payer: 59 | Admitting: Internal Medicine

## 2017-07-23 IMAGING — US US ABDOMEN COMPLETE
1 series · 14 of 25 positions shown · non-contrast
Comparison: None.

CLINICAL DATA: Abdominal pain, bloating, and fever for 1 month,
worse in the past week. Vomiting and diarrhea.

EXAM:
ABDOMEN ULTRASOUND COMPLETE

[Series 1: us abdomen complete · 0.22mm/px · 14 of 111 slices shown]
[im 1/111]
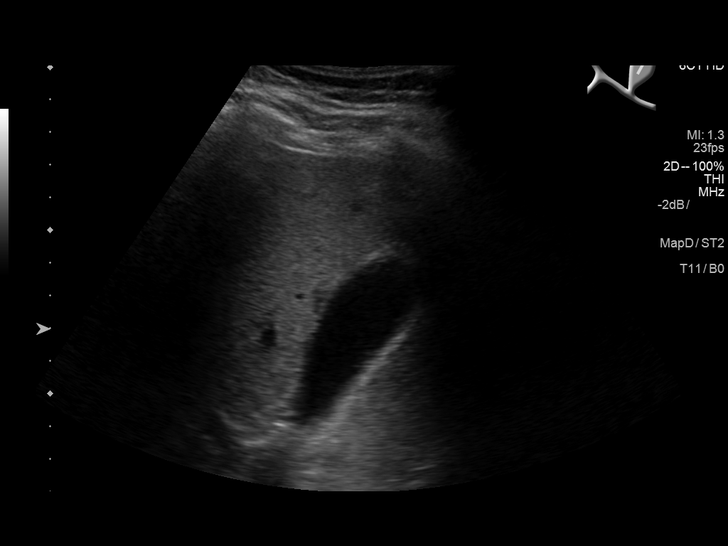
[im 10/111]
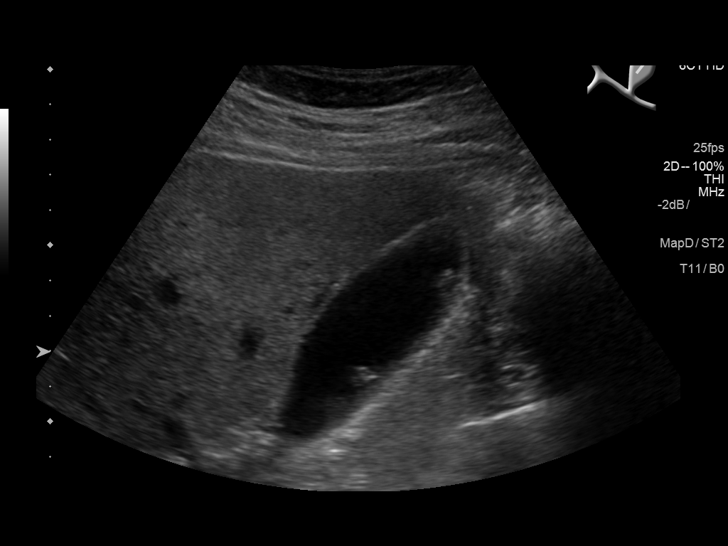
[im 19/111]
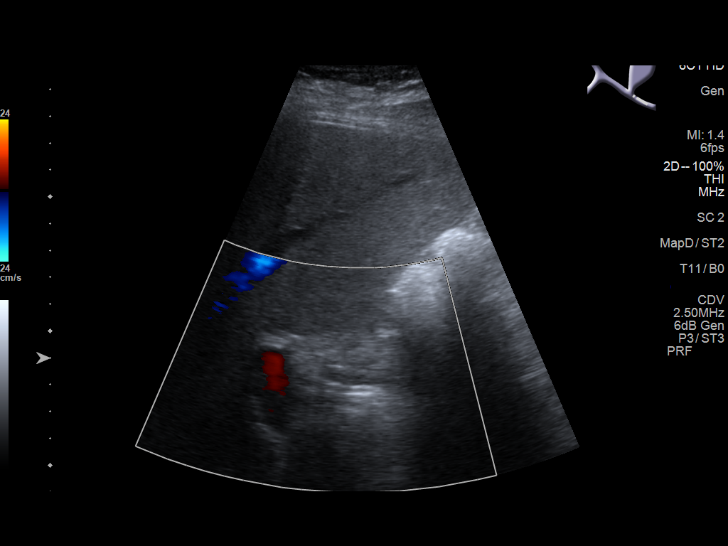
[im 28/111]
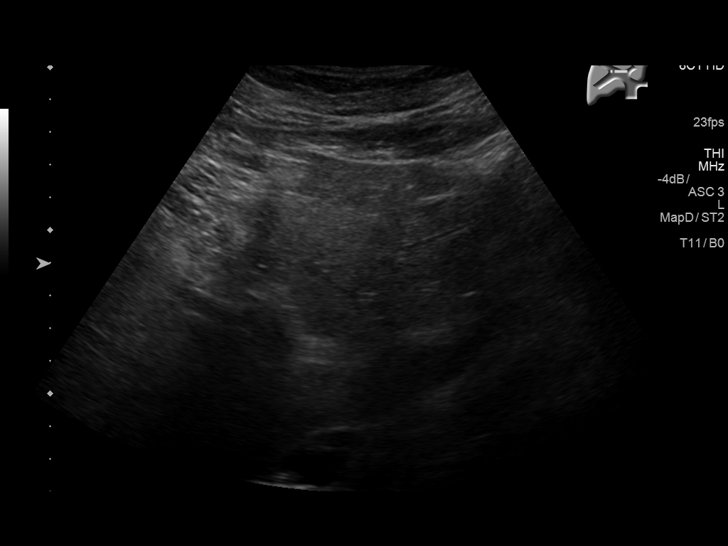
[im 37/111]
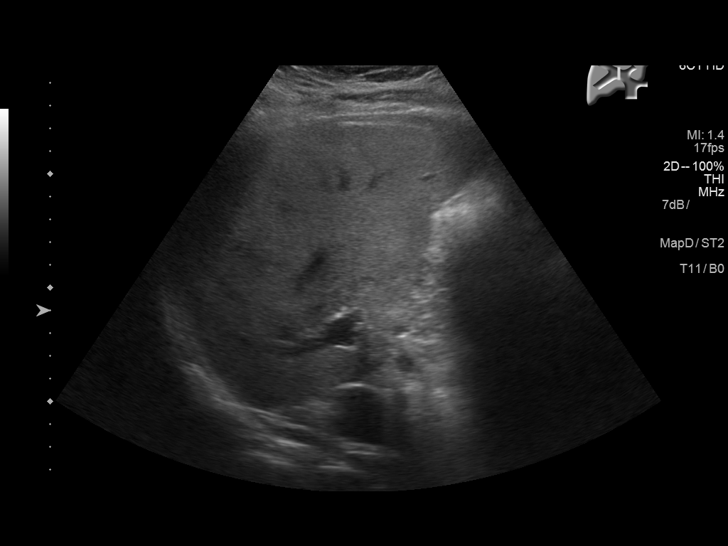
[im 42/111]
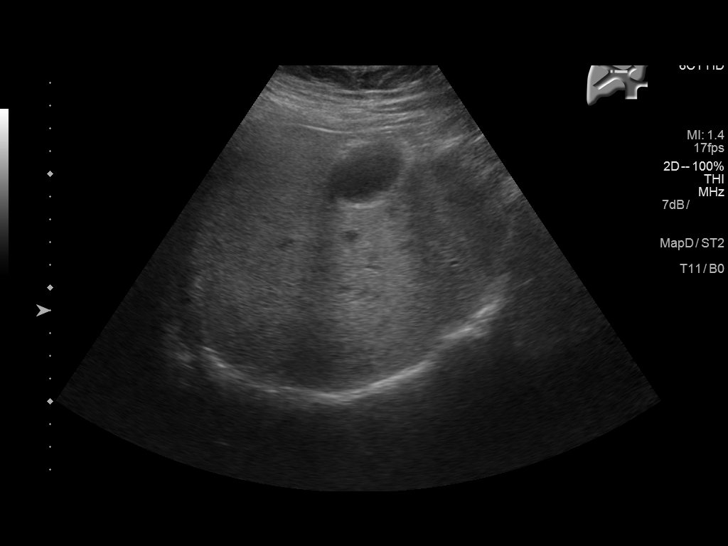
[im 51/111]
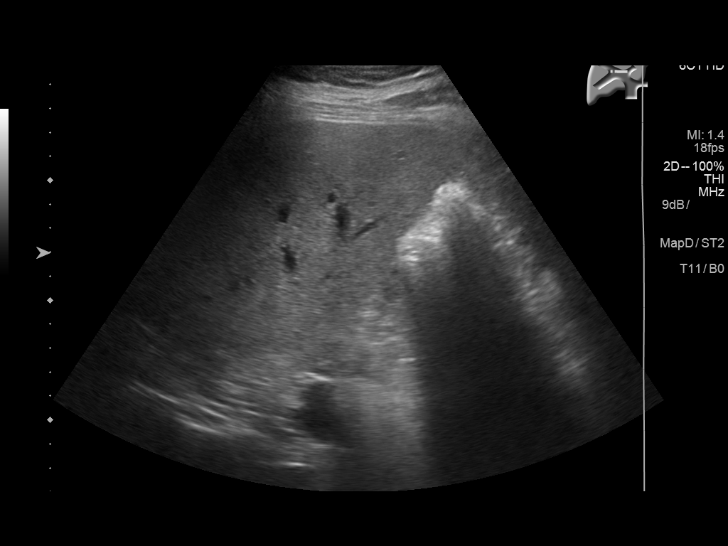
[im 60/111]
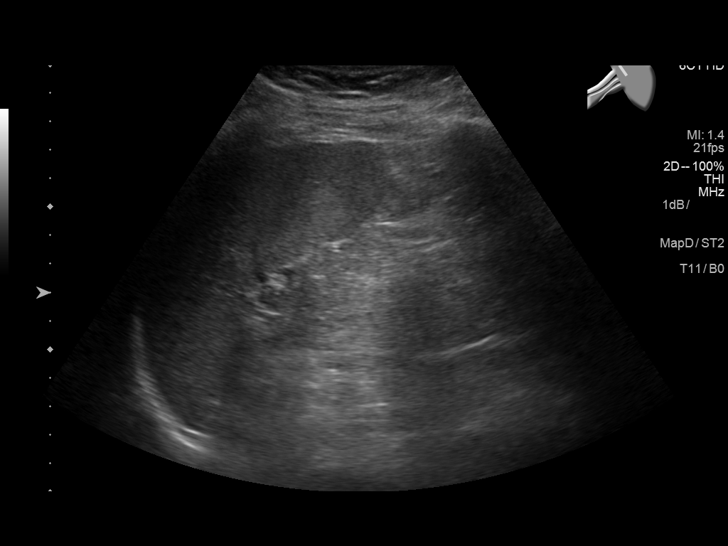
[im 69/111]
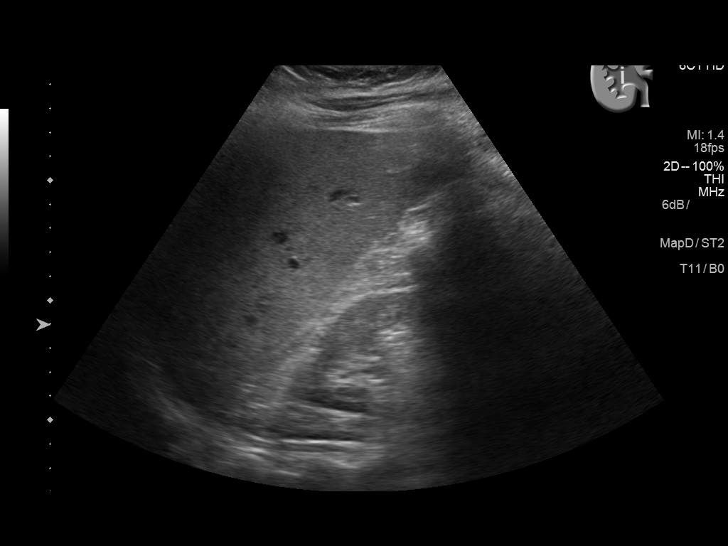
[im 74/111]
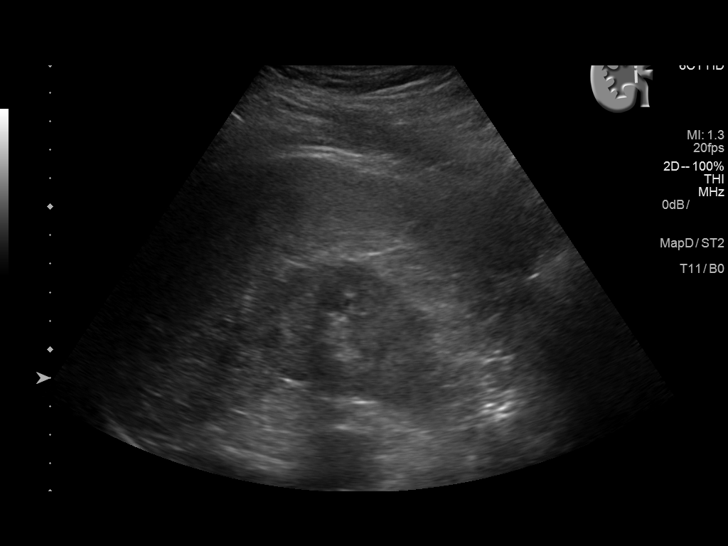
[im 83/111]
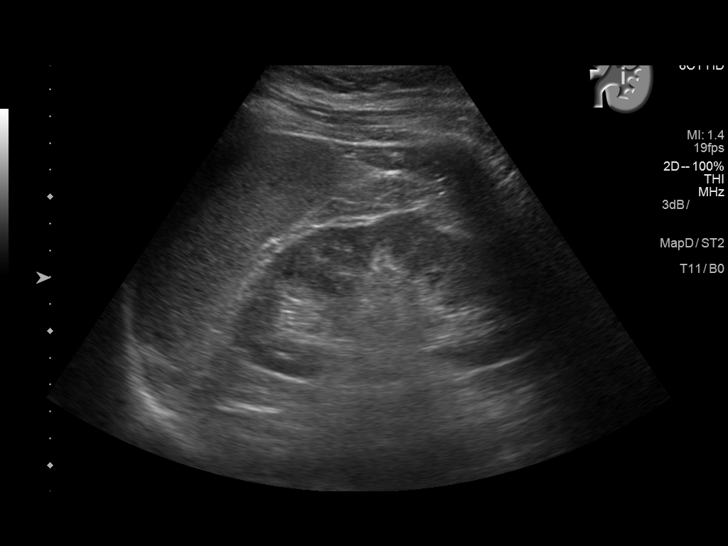
[im 92/111]
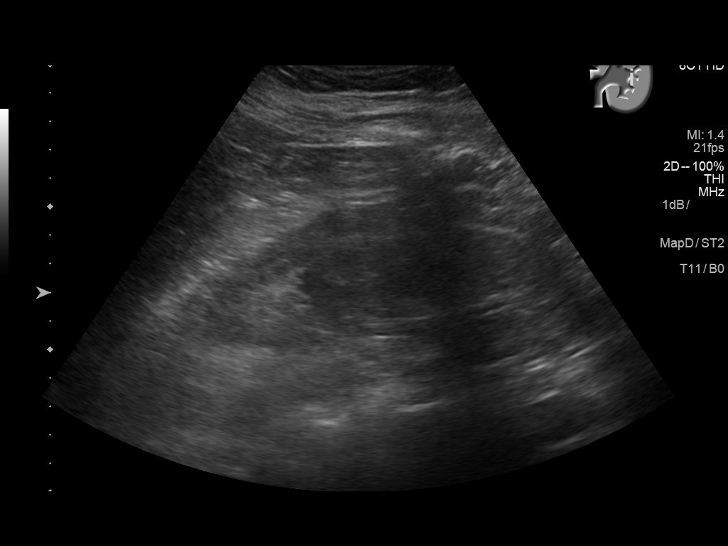
[im 101/111]
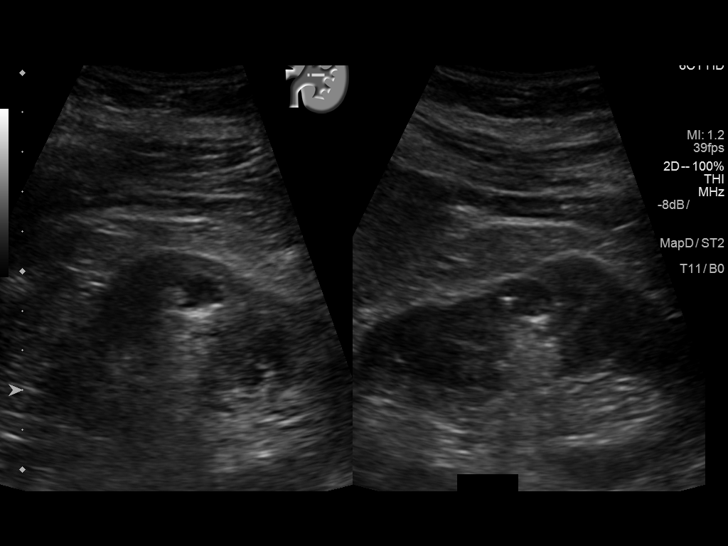
[im 111/111]
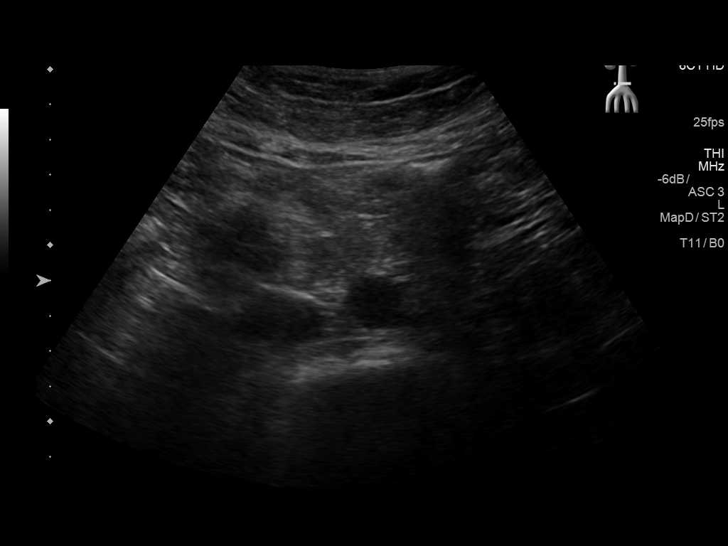

[14 of 25 positions shown; findings below may reference images not displayed]

FINDINGS: Gallbladder: Multiple mobile stones measuring up to 9 mm in size.
Gallbladder sludge. No wall thickening. No sonographic Murphy sign
noted by sonographer.

Common bile duct: Diameter: 4 mm

Liver: No focal lesion identified. Within normal limits in
parenchymal echogenicity.

IVC: No abnormality visualized.

Pancreas: Largely obscured by bowel gas.

Spleen: Size and appearance within normal limits.

Right Kidney: Length: 10.9 cm. Echogenicity within normal limits. No
mass or hydronephrosis visualized.

Left Kidney: Length: 12.1 cm. Echogenicity within normal limits. No
hydronephrosis. 13 x 9 x 14 mm hypoechoic/cystic lesion with
internal echogenic material.

Abdominal aorta: No aneurysm visualized.

Other findings: None.
IMPRESSION: 1. Cholelithiasis without evidence of acute cholecystitis.
2. 14 mm complex left renal lesion favored to reflect a milk of
calcium cyst.

## 2019-10-23 ENCOUNTER — Ambulatory Visit (INDEPENDENT_AMBULATORY_CARE_PROVIDER_SITE_OTHER): Payer: BLUE CROSS/BLUE SHIELD | Admitting: Internal Medicine

## 2019-10-23 ENCOUNTER — Other Ambulatory Visit: Payer: Self-pay

## 2019-10-23 ENCOUNTER — Encounter: Payer: Self-pay | Admitting: Internal Medicine

## 2019-10-23 VITALS — BP 122/82 | HR 82 | Temp 97.9°F | Ht 71.0 in | Wt 216.0 lb

## 2019-10-23 DIAGNOSIS — E785 Hyperlipidemia, unspecified: Secondary | ICD-10-CM

## 2019-10-23 DIAGNOSIS — E119 Type 2 diabetes mellitus without complications: Secondary | ICD-10-CM

## 2019-10-23 DIAGNOSIS — I10 Essential (primary) hypertension: Secondary | ICD-10-CM | POA: Diagnosis not present

## 2019-10-23 DIAGNOSIS — E1169 Type 2 diabetes mellitus with other specified complication: Secondary | ICD-10-CM

## 2019-10-23 DIAGNOSIS — E118 Type 2 diabetes mellitus with unspecified complications: Secondary | ICD-10-CM | POA: Diagnosis not present

## 2019-10-23 DIAGNOSIS — R7989 Other specified abnormal findings of blood chemistry: Secondary | ICD-10-CM | POA: Diagnosis not present

## 2019-10-23 MED ORDER — METFORMIN HCL ER 500 MG PO TB24: 500.0000 mg | ORAL_TABLET | Freq: Two times a day (BID) | ORAL | 1 refills | Status: DC

## 2019-10-23 MED ORDER — TESTOSTERONE 20.25 MG/ACT (1.62%) TD GEL: TRANSDERMAL | 5 refills | Status: DC

## 2019-10-23 MED ORDER — LISINOPRIL 10 MG PO TABS: 10.0000 mg | ORAL_TABLET | Freq: Every day | ORAL | 1 refills | Status: DC

## 2019-10-23 NOTE — Progress Notes (Signed)
Date:  10/23/2019   Name:  Benjamin Nelson   DOB:  10/05/1965   MRN:  [id number]   Chief Complaint: Diabetes (follow up/ moving back here from [place]) and Hypertension He moved to [place] and got married.  Now moving back to [place] his father's house and building his father a smaller house on the same property.  He is a retired Games developer.  Hypertension This is a chronic problem. The problem is controlled. Pertinent negatives include no chest pain, headaches, palpitations or shortness of breath. Past treatments include ACE inhibitors.  Diabetes He presents for his follow-up diabetic visit. He has type 2 diabetes mellitus. Pertinent negatives for hypoglycemia include no headaches or tremors. Pertinent negatives for diabetes include no chest pain, no fatigue, no polydipsia and no polyuria. Symptoms are stable. Current diabetic treatment includes oral agent (monotherapy). He is compliant with treatment all of the time. An ACE inhibitor/angiotensin II receptor blocker is being taken.  Hyperlipidemia This is a chronic problem. Recent lipid tests were reviewed and are normal. Pertinent negatives include no chest pain or shortness of breath. He is currently on no antihyperlipidemic treatment (previously on lipitor - stopped for unknown reason by his PCP).  Hypogonadism - previously on topical androgel.  His MD in [place] changed him to injections every 2 weeks.  He prefers the topical because it gives more steady dosing.  Lab Results  Component Value Date   CREATININE 0.98 10/02/2016   BUN 17 10/02/2016   NA 142 10/02/2016   K 4.0 10/02/2016   CL 101 10/02/2016   CO2 24 10/02/2016   Lab Results  Component Value Date   CHOL 157 10/02/2016   HDL 39 (L) 10/02/2016   LDLCALC 101 (H) 10/02/2016   TRIG 87 10/02/2016   CHOLHDL 4.0 10/02/2016   No results found for: TSH Lab Results  Component Value Date   HGBA1C 6.7 (H) 10/02/2016   Lab Results  Component Value Date   WBC 6.9  10/02/2016   HGB 16.7 10/02/2016   HCT 48.3 10/02/2016   MCV 88 10/02/2016   PLT 141 (L) 10/02/2016   Lab Results  Component Value Date   ALT 17 10/02/2016   AST 19 10/02/2016   ALKPHOS 81 10/02/2016   BILITOT 0.4 10/02/2016     Review of Systems  Constitutional: Negative for appetite change, fatigue and unexpected weight change.  Eyes: Negative for visual disturbance.  Respiratory: Negative for cough, shortness of breath and wheezing.   Cardiovascular: Negative for chest pain, palpitations and leg swelling.  Gastrointestinal: Negative for abdominal pain and blood in stool.  Endocrine: Negative for polydipsia and polyuria.  Genitourinary: Negative for dysuria and hematuria.  Skin: Negative for color change and rash.  Neurological: Negative for tremors, numbness and headaches.  Psychiatric/Behavioral: Negative for dysphoric mood.    Patient Active Problem List   Diagnosis Date Noted  . OSA (obstructive sleep apnea) 04/24/2016  . Environmental and seasonal allergies 09/21/2015  . Controlled type 2 diabetes mellitus without complication, without long-term current use of insulin ([place]) 05/04/2015  . Hyperlipidemia associated with type 2 diabetes mellitus ([place]) 12/30/2014  . Essential (primary) hypertension 12/29/2014  . Decreased libido 12/29/2014  . Decreased testosterone level 12/29/2014  . Hx of colonic polyps - sessile serrated 04/30/2014    Allergies  Allergen Reactions  . Benicar Hct [Olmesartan Medoxomil-Hctz] Hives and Swelling    Past Surgical History:  Procedure Laterality Date  . CHOLECYSTECTOMY, LAPAROSCOPIC  07/04/2016  [place]  . COLONOSCOPY  [id number]   2 sessile serrated polyps  . KNEE ARTHROSCOPY Left 2004   left torn meniscus  . TONSILLECTOMY  1971    Social History   Tobacco Use  . Smoking status: Current Every Day Smoker    Packs/day: 0.50  . Smokeless tobacco: Never Used  Vaping Use  . Vaping Use: Never used  Substance Use Topics  .  Alcohol use: Yes    Alcohol/week: 3.0 standard drinks    Types: 3 Glasses of wine per week    Comment: occasional  . Drug use: No     Medication list has been reviewed and updated.  Current Meds  Medication Sig  . glucose blood (ONE TOUCH ULTRA TEST) test strip   . lisinopril (ZESTRIL) 10 MG tablet Take 10 mg by mouth daily.  Marland Kitchen loratadine (CLARITIN) 10 MG tablet Take 10 mg by mouth daily.  . metFORMIN (GLUCOPHAGE-XR) 500 MG 24 hr tablet Take 1 tablet (500 mg total) by mouth 2 (two) times daily. (Patient taking differently: Take 1,000 mg by mouth daily with breakfast. )  . Multiple Vitamins-Minerals (CENTRUM ADULTS PO) Take 1 tablet by mouth daily.  Marland Kitchen testosterone cypionate (DEPOTESTOSTERONE CYPIONATE) 200 MG/ML injection Inject 200 mg into the muscle every 14 (fourteen) days.    PHQ 2/9 Scores 10/23/2019 11/23/2015  PHQ - 2 Score 0 0  PHQ- 9 Score 0 -    GAD 7 : Generalized Anxiety Score 10/23/2019  Nervous, Anxious, on Edge 0  Control/stop worrying 0  Worry too much - different things 0  Trouble relaxing 0  Restless 0  Easily annoyed or irritable 0  Afraid - awful might happen 0  Total GAD 7 Score 0  Anxiety Difficulty Not difficult at all    BP Readings from Last 3 Encounters:  10/23/19 122/82  10/02/16 120/76  07/26/16 120/64    Physical Exam Vitals and nursing note reviewed.  Constitutional:      General: He is not in acute distress.    Appearance: He is well-developed.  HENT:     Head: Normocephalic and atraumatic.  Cardiovascular:     Rate and Rhythm: Normal rate and regular rhythm.     Pulses: Normal pulses.     Heart sounds: No murmur heard.   Pulmonary:     Effort: Pulmonary effort is normal. No respiratory distress.     Breath sounds: No wheezing or rhonchi.  Musculoskeletal:        General: Normal range of motion.     Cervical back: Normal range of motion and neck supple.     Right lower leg: No edema.     Left lower leg: No edema.  Skin:     General: Skin is warm and dry.     Capillary Refill: Capillary refill takes less than 2 seconds.     Findings: No rash.  Neurological:     General: No focal deficit present.     Mental Status: He is alert and oriented to person, place, and time.  Psychiatric:        Mood and Affect: Mood normal.        Behavior: Behavior normal.     Wt Readings from Last 3 Encounters:  10/23/19 216 lb (98 kg)  10/02/16 234 lb (106.1 kg)  07/26/16 229 lb (103.9 kg)    BP 122/82   Pulse 82   Temp 97.9 F (36.6 C) (Oral)   Ht 5\' 11"  (1.803 m)  Wt 216 lb (98 kg)   SpO2 98%   BMI 30.13 kg/m   Assessment and Plan: 1. Essential (primary) hypertension Clinically stable exam with well controlled BP on lisinopril. Tolerating medications without side effects at this time. Pt to continue current regimen and low sodium diet; benefits of regular exercise as able discussed. - CBC with Differential/Platelet - lisinopril (ZESTRIL) 10 MG tablet; Take 1 tablet (10 mg total) by mouth daily.  Dispense: 90 tablet; Refill: 1  2. Type II diabetes mellitus with complication (HCC) Clinically stable by exam and report without s/s of hypoglycemia. DM complicated by htn. Tolerating medications well without side effects or other concerns. - Comprehensive metabolic panel - Hemoglobin A1c - metFORMIN (GLUCOPHAGE-XR) 500 MG 24 hr tablet; Take 1 tablet (500 mg total) by mouth 2 (two) times daily.  Dispense: 180 tablet; Refill: 1  3. Hyperlipidemia associated with type 2 diabetes mellitus ([place]) Should be on statin therapy - stopped by previous PCP for unknown reason Pt previously took lipitor without side effects - will check labs and advise - Lipid panel  4. Decreased testosterone level Finish current one month supply of injectable then transition to daily topical Will check levels at next visit - Testosterone (ANDROGEL PUMP) 20.25 MG/ACT (1.62%) GEL; APPLY 4 PUMPS TOPICALLY DAILY AS DIRECTED.  Dispense: 150 g;  Refill: 5   Partially dictated using Editor, commissioning. Any errors are unintentional.  Halina Maidens, MD [facility]  10/23/2019

## 2019-10-24 ENCOUNTER — Other Ambulatory Visit: Payer: Self-pay

## 2019-10-24 DIAGNOSIS — E7800 Pure hypercholesterolemia, unspecified: Secondary | ICD-10-CM

## 2019-10-24 LAB — CBC WITH DIFFERENTIAL/PLATELET
Basophils Absolute: 0.1 10*3/uL (ref 0.0–0.2)
Basos: 1 %
EOS (ABSOLUTE): 0.4 10*3/uL (ref 0.0–0.4)
Eos: 4 %
Hematocrit: 55.8 % — ABNORMAL HIGH (ref 37.5–51.0)
Hemoglobin: 19.1 g/dL — ABNORMAL HIGH (ref 13.0–17.7)
Immature Grans (Abs): 0 10*3/uL (ref 0.0–0.1)
Immature Granulocytes: 0 %
Lymphocytes Absolute: 2.2 10*3/uL (ref 0.7–3.1)
Lymphs: 21 %
MCH: 31 pg (ref 26.6–33.0)
MCHC: 34.2 g/dL (ref 31.5–35.7)
MCV: 91 fL (ref 79–97)
Monocytes Absolute: 0.8 10*3/uL (ref 0.1–0.9)
Monocytes: 7 %
Neutrophils Absolute: 7.3 10*3/uL — ABNORMAL HIGH (ref 1.4–7.0)
Neutrophils: 67 %
Platelets: 164 10*3/uL (ref 150–450)
RBC: 6.16 x10E6/uL — ABNORMAL HIGH (ref 4.14–5.80)
RDW: 13.1 % (ref 11.6–15.4)
WBC: 10.8 10*3/uL (ref 3.4–10.8)

## 2019-10-24 LAB — COMPREHENSIVE METABOLIC PANEL
ALT: 34 IU/L (ref 0–44)
AST: 25 IU/L (ref 0–40)
Albumin/Globulin Ratio: 1.7 (ref 1.2–2.2)
Albumin: 4.3 g/dL (ref 3.8–4.9)
Alkaline Phosphatase: 86 IU/L (ref 48–121)
BUN/Creatinine Ratio: 16 (ref 9–20)
BUN: 19 mg/dL (ref 6–24)
Bilirubin Total: 0.5 mg/dL (ref 0.0–1.2)
CO2: 26 mmol/L (ref 20–29)
Calcium: 9.5 mg/dL (ref 8.7–10.2)
Chloride: 101 mmol/L (ref 96–106)
Creatinine, Ser: 1.17 mg/dL (ref 0.76–1.27)
GFR calc Af Amer: 81 mL/min/{1.73_m2} (ref >59)
GFR calc non Af Amer: 70 mL/min/{1.73_m2} (ref >59)
Globulin, Total: 2.5 g/dL (ref 1.5–4.5)
Glucose: 105 mg/dL — ABNORMAL HIGH (ref 65–99)
Potassium: 4.3 mmol/L (ref 3.5–5.2)
Sodium: 141 mmol/L (ref 134–144)
Total Protein: 6.8 g/dL (ref 6.0–8.5)

## 2019-10-24 LAB — LIPID PANEL
Chol/HDL Ratio: 5.4 ratio — ABNORMAL HIGH (ref 0.0–5.0)
Cholesterol, Total: 184 mg/dL (ref 100–199)
HDL: 34 mg/dL — ABNORMAL LOW
LDL Chol Calc (NIH): 126 mg/dL — ABNORMAL HIGH (ref 0–99)
Triglycerides: 130 mg/dL (ref 0–149)
VLDL Cholesterol Cal: 24 mg/dL (ref 5–40)

## 2019-10-24 LAB — HEMOGLOBIN A1C
Est. average glucose Bld gHb Est-mCnc: 143 mg/dL
Hgb A1c MFr Bld: 6.6 % — ABNORMAL HIGH (ref 4.8–5.6)

## 2019-10-24 MED ORDER — ATORVASTATIN CALCIUM 10 MG PO TABS: 10.0000 mg | ORAL_TABLET | Freq: Every day | ORAL | 11 refills | Status: DC

## 2019-11-07 ENCOUNTER — Telehealth: Payer: Self-pay

## 2019-11-07 NOTE — Telephone Encounter (Signed)
Completed PA on testosterone gel on [email or web address]: [id number]  - PA Case ID: [id number]  - Rx #: [id number]   PA was APPROVED.  CM

## 2019-12-25 ENCOUNTER — Telehealth: Payer: Self-pay

## 2019-12-25 ENCOUNTER — Other Ambulatory Visit: Payer: Self-pay | Admitting: Internal Medicine

## 2019-12-25 DIAGNOSIS — R7989 Other specified abnormal findings of blood chemistry: Secondary | ICD-10-CM

## 2019-12-25 NOTE — Telephone Encounter (Signed)
Requested medication (s) are due for refill today - no  Requested medication (s) are on the active medication list -yes  Future visit scheduled -yes  Last refill: 02/29/2016- request from expired Rx- new Rx written in August with RF  Notes to clinic: Request RF of medication not assigned protocol  Requested Prescriptions  Pending Prescriptions Disp Refills   ANDROGEL PUMP 20.25 MG/ACT (1.62%) GEL [Pharmacy Med Name: ANDROGEL 1.62% GEL PUMP] 150 g 0    Sig: APPLY 4 PUMPS TOPICALLY DAILY AS DIRECTED.      Off-Protocol Failed - 12/25/2019 11:42 AM      Failed - Medication not assigned to a protocol, review manually.      Passed - Valid encounter within last 12 months    Recent Outpatient Visits           2 months ago Essential (primary) hypertension   [facility] Glean Hess, MD   3 years ago Annual physical exam   [facility] Glean Hess, MD   3 years ago Pharyngitis, unspecified etiology   [facility] Glean Hess, MD   3 years ago Generalized abdominal pain   [facility] Glean Hess, MD   3 years ago Cough   [facility] Glean Hess, MD       Future Appointments             In 1 month Army Melia Jesse Sans, MD [facility]                Requested Prescriptions  Pending Prescriptions Disp Refills   ANDROGEL PUMP 20.25 MG/ACT (1.62%) GEL [Pharmacy Med Name: ANDROGEL 1.62% GEL PUMP] 150 g 0    Sig: APPLY 4 PUMPS TOPICALLY DAILY AS DIRECTED.      Off-Protocol Failed - 12/25/2019 11:42 AM      Failed - Medication not assigned to a protocol, review manually.      Passed - Valid encounter within last 12 months    Recent Outpatient Visits           2 months ago Essential (primary) hypertension   [facility] Glean Hess, MD   3 years ago Annual physical exam   [facility] Glean Hess, MD   3 years ago Pharyngitis, unspecified etiology    [facility] Glean Hess, MD   3 years ago Generalized abdominal pain   [facility] Glean Hess, MD   3 years ago Cough   [facility] Glean Hess, MD       Future Appointments             In 1 month Army Melia Jesse Sans, MD [facility], [facility]

## 2019-12-25 NOTE — Telephone Encounter (Signed)
Pharmacy called pt wants refills sent there now.  Benjamin Nelson Address: [address] [place] [address] Phone: [phone] Fax: [phone]

## 2019-12-26 ENCOUNTER — Other Ambulatory Visit: Payer: Self-pay | Admitting: Internal Medicine

## 2019-12-26 ENCOUNTER — Other Ambulatory Visit: Payer: Self-pay

## 2019-12-26 DIAGNOSIS — I10 Essential (primary) hypertension: Secondary | ICD-10-CM

## 2019-12-26 DIAGNOSIS — E118 Type 2 diabetes mellitus with unspecified complications: Secondary | ICD-10-CM

## 2019-12-26 DIAGNOSIS — E7800 Pure hypercholesterolemia, unspecified: Secondary | ICD-10-CM

## 2019-12-26 MED ORDER — METFORMIN HCL ER 500 MG PO TB24: 500.0000 mg | ORAL_TABLET | Freq: Two times a day (BID) | ORAL | 0 refills | Status: DC

## 2019-12-26 MED ORDER — LISINOPRIL 10 MG PO TABS: 10.0000 mg | ORAL_TABLET | Freq: Every day | ORAL | 0 refills | Status: DC

## 2019-12-26 MED ORDER — ATORVASTATIN CALCIUM 10 MG PO TABS: 10.0000 mg | ORAL_TABLET | Freq: Every day | ORAL | 0 refills | Status: DC

## 2019-12-26 NOTE — Telephone Encounter (Signed)
He didn't mention he needed refills just called to update pharmacy info

## 2020-01-06 ENCOUNTER — Other Ambulatory Visit: Payer: Self-pay | Admitting: Internal Medicine

## 2020-01-06 ENCOUNTER — Other Ambulatory Visit: Payer: Self-pay

## 2020-01-06 DIAGNOSIS — E118 Type 2 diabetes mellitus with unspecified complications: Secondary | ICD-10-CM

## 2020-01-06 DIAGNOSIS — I10 Essential (primary) hypertension: Secondary | ICD-10-CM

## 2020-01-06 DIAGNOSIS — E7800 Pure hypercholesterolemia, unspecified: Secondary | ICD-10-CM

## 2020-01-06 DIAGNOSIS — R7989 Other specified abnormal findings of blood chemistry: Secondary | ICD-10-CM

## 2020-01-06 NOTE — Telephone Encounter (Signed)
Requested medication (s) are due for refill today: yes  Requested medication (s) are on the active medication list: yes  Last refill:  12/26/19  Future visit scheduled: yes  Notes to clinic:  no assigned protocol    Requested Prescriptions  Pending Prescriptions Disp Refills   ANDROGEL PUMP 20.25 MG/ACT (1.62%) GEL [Pharmacy Med Name: ANDROGEL 1.62% GEL PUMP] 150 g 0    Sig: APPLY 4 PUMPS TOPICALLY DAILY AS DIRECTED.      Off-Protocol Failed - 01/06/2020  1:46 PM      Failed - Medication not assigned to a protocol, review manually.      Passed - Valid encounter within last 12 months    Recent Outpatient Visits           2 months ago Essential (primary) hypertension   [facility] Glean Hess, MD   3 years ago Annual physical exam   [facility] Glean Hess, MD   3 years ago Pharyngitis, unspecified etiology   [facility] Glean Hess, MD   3 years ago Generalized abdominal pain   [facility] Glean Hess, MD   3 years ago Cough   [facility] Glean Hess, MD       Future Appointments             In 1 month Army Melia Jesse Sans, MD [facility]             Refused Prescriptions Disp Refills   atorvastatin (LIPITOR) 10 MG tablet [Pharmacy Med Name: ATORVASTATIN 10 MG TABLET] 30 tablet 0    Sig: TAKE ONE TABLET BY MOUTH AT BEDTIME.      Cardiovascular:  Antilipid - Statins Failed - 01/06/2020  1:46 PM      Failed - LDL in normal range and within 360 days    LDL Chol Calc (NIH)  Date Value Ref Range Status  10/23/2019 126 (H) 0 - 99 mg/dL Final          Failed - HDL in normal range and within 360 days    HDL  Date Value Ref Range Status  10/23/2019 34 (L) >39 mg/dL Final          Passed - Total Cholesterol in normal range and within 360 days    Cholesterol, Total  Date Value Ref Range Status  10/23/2019 184 100 - 199 mg/dL Final          Passed - Triglycerides  in normal range and within 360 days    Triglycerides  Date Value Ref Range Status  10/23/2019 130 0 - 149 mg/dL Final          Passed - Patient is not pregnant      Passed - Valid encounter within last 12 months    Recent Outpatient Visits           2 months ago Essential (primary) hypertension   [facility] Glean Hess, MD   3 years ago Annual physical exam   [facility] Glean Hess, MD   3 years ago Pharyngitis, unspecified etiology   [facility] Glean Hess, MD   3 years ago Generalized abdominal pain   [facility] Glean Hess, MD   3 years ago Cough   [place], Laura H, MD       Future Appointments  In 1 month Army Melia, Jesse Sans, MD [facility]              valsartan (DIOVAN) 320 MG tablet [Pharmacy Med Name: VALSARTAN 320 MG TABLET] 30 tablet 0    Sig: TAKE 1 TABLET BY MOUTH ONCE DAILY.      Cardiovascular:  Angiotensin Receptor Blockers Passed - 01/06/2020  1:46 PM      Passed - Cr in normal range and within 180 days    Creatinine, Ser  Date Value Ref Range Status  10/23/2019 1.17 0.76 - 1.27 mg/dL Final          Passed - K in normal range and within 180 days    Potassium  Date Value Ref Range Status  10/23/2019 4.3 3.5 - 5.2 mmol/L Final          Passed - Patient is not pregnant      Passed - Last BP in normal range    BP Readings from Last 1 Encounters:  10/23/19 122/82          Passed - Valid encounter within last 6 months    Recent Outpatient Visits           2 months ago Essential (primary) hypertension   [facility] Glean Hess, MD   3 years ago Annual physical exam   [facility] Glean Hess, MD   3 years ago Pharyngitis, unspecified etiology   [facility] Glean Hess, MD   3 years ago Generalized abdominal pain   [facility] Glean Hess, MD   3 years ago  Cough   [facility] Glean Hess, MD       Future Appointments             In 1 month Army Melia Jesse Sans, MD [facility]              metFORMIN (GLUCOPHAGE-XR) 500 MG 24 hr tablet [Pharmacy Med Name: METFORMIN HCL ER 500 MG TAB] 60 tablet 0    Sig: TAKE 1 TABLET BY MOUTH TWICE DAILY      Endocrinology:  Diabetes - Biguanides Passed - 01/06/2020  1:46 PM      Passed - Cr in normal range and within 360 days    Creatinine, Ser  Date Value Ref Range Status  10/23/2019 1.17 0.76 - 1.27 mg/dL Final          Passed - HBA1C is between 0 and 7.9 and within 180 days    Hgb A1c MFr Bld  Date Value Ref Range Status  10/23/2019 6.6 (H) 4.8 - 5.6 % Final    Comment:             Prediabetes: 5.7 - 6.4          Diabetes: >6.4          Glycemic control for adults with diabetes: <7.0           Passed - eGFR in normal range and within 360 days    GFR calc Af Amer  Date Value Ref Range Status  10/23/2019 81 >59 mL/min/1.73 Final    Comment:    **[facility] currently reports eGFR in compliance with the current**   recommendations of the [employer]. [facility] will   update reporting as new guidelines are published from the NKF-ASN   Task force.    GFR calc non Af Amer  Date Value Ref Range Status  10/23/2019 70 >59 mL/min/1.73 Final          Passed - Valid encounter within last 6 months    Recent Outpatient Visits           2 months ago Essential (primary) hypertension   [facility] Glean Hess, MD   3 years ago Annual physical exam   [facility] Glean Hess, MD   3 years ago Pharyngitis, unspecified etiology   [facility] Glean Hess, MD   3 years ago Generalized abdominal pain   [facility] Glean Hess, MD   3 years ago Cough   [facility] Glean Hess, MD       Future Appointments             In 1 month Army Melia Jesse Sans, MD [facility], [facility]

## 2020-01-06 NOTE — Telephone Encounter (Signed)
Called wife left VM to call back. Pt needs to call [facility] and tell them to transfer there meds to the new pharmacy.  Will route result note to [facility] Nurse Triage for follow up when patient returns call to clinic. CRM created.  KP

## 2020-01-06 NOTE — Telephone Encounter (Signed)
Copied from [place] [id number]. Topic: General - Inquiry >> Jan 06, 2020  2:37 PM Greggory Keen D wrote: Reason for CRM: Pt's wife called saying their insurance will not pay for a rx anymore to [facility].  His rx's have to be sent to [facility] in [place].  He is out  Of the Andro gel and needs a new order sent to the new pharmacy although he has [facility]ill at [facility].

## 2020-01-08 NOTE — Telephone Encounter (Signed)
Phone call to pt. Re: need to have [facility] to get medications transferred.  Pt. Verb. Much frustration in being told that [facility] is short staffed and they will work on the transfer of meds to [facility], but could not promise when this would be done.  Pt. Stated "[place] has called [facility] about [facility] times, and gets put on hold for like 20 minutes.  I have done everything I can do, and this situation really pisses me off."   Verified with patient the medication he needs at this time.  Stated "I have at least a months supply of Atorvastatin, Lisinopril, and Metformin.  I am fine with my diabetic testing supplies, also.  I need the Testosterone sent over.  I was told by [facility] that this has to go through the pre-approval process again, since my insurance has changed.  This is the reason I am transferring my medication over to a different pharmacy, because [facility] is no longer in network with my insurance."  Advised pt. Will send high priority message through to Dr. Gaspar Cola nurse to have the pre-auth. Started for the Testosterone.  Verb. Understanding.

## 2020-01-08 NOTE — Telephone Encounter (Signed)
Requested medication (s) are due for refill today:  Yes  Requested medication (s) are on the active medication list:  Yes  Future visit scheduled:  Yes  Last Refill: 10/23/19 150 g./ RF x 5  Notes to Clinic:  See detailed note.  Pt's insurance has changed.  Needs Testosterone pre- authorized through new insurance; send to [facility]. (pt. has been unsuccessful in getting [facility] to transfer Rx's to new pharmacy)  Requested Prescriptions  Pending Prescriptions Disp Refills   Testosterone (ANDROGEL PUMP) 20.25 MG/ACT (1.62%) GEL 150 g 5    Sig: APPLY 4 PUMPS TOPICALLY DAILY AS DIRECTED.      Off-Protocol Failed - 01/08/2020  9:09 AM      Failed - Medication not assigned to a protocol, review manually.      Passed - Valid encounter within last 12 months    Recent Outpatient Visits           2 months ago Essential (primary) hypertension   [facility] Glean Hess, MD   3 years ago Annual physical exam   [facility] Glean Hess, MD   3 years ago Pharyngitis, unspecified etiology   [facility] Glean Hess, MD   3 years ago Generalized abdominal pain   [facility] Glean Hess, MD   3 years ago Cough   [facility] Glean Hess, MD       Future Appointments             In 1 month Army Melia Jesse Sans, MD [facility], [facility]

## 2020-02-13 ENCOUNTER — Other Ambulatory Visit: Payer: Self-pay

## 2020-02-13 ENCOUNTER — Ambulatory Visit (INDEPENDENT_AMBULATORY_CARE_PROVIDER_SITE_OTHER): Payer: Self-pay | Admitting: Internal Medicine

## 2020-02-13 ENCOUNTER — Encounter: Payer: Self-pay | Admitting: Internal Medicine

## 2020-02-13 VITALS — BP 118/82 | HR 79 | Temp 98.1°F | Ht 71.0 in | Wt 226.0 lb

## 2020-02-13 DIAGNOSIS — Z0000 Encounter for general adult medical examination without abnormal findings: Secondary | ICD-10-CM

## 2020-02-13 DIAGNOSIS — Z1211 Encounter for screening for malignant neoplasm of colon: Secondary | ICD-10-CM

## 2020-02-13 DIAGNOSIS — E785 Hyperlipidemia, unspecified: Secondary | ICD-10-CM

## 2020-02-13 DIAGNOSIS — Z23 Encounter for immunization: Secondary | ICD-10-CM

## 2020-02-13 DIAGNOSIS — I10 Essential (primary) hypertension: Secondary | ICD-10-CM

## 2020-02-13 DIAGNOSIS — E118 Type 2 diabetes mellitus with unspecified complications: Secondary | ICD-10-CM

## 2020-02-13 DIAGNOSIS — E1169 Type 2 diabetes mellitus with other specified complication: Secondary | ICD-10-CM

## 2020-02-13 DIAGNOSIS — R7989 Other specified abnormal findings of blood chemistry: Secondary | ICD-10-CM

## 2020-02-13 DIAGNOSIS — Z125 Encounter for screening for malignant neoplasm of prostate: Secondary | ICD-10-CM

## 2020-02-13 NOTE — Progress Notes (Signed)
Date:  02/13/2020   Name:  Benjamin Nelson   DOB:  1965/09/27   MRN:  [id number]   Chief Complaint: Annual Exam, Hypertension, Diabetes (120 blood sugar yesterday morning), and Flu Vaccine  Benjamin Nelson is a 54 y.o. male who presents today for his Complete Annual Exam. He feels well. He reports exercising walking X7 days a week. He reports he is sleeping well.   Colonoscopy: 04/2014 repeat due (done at [facility] in [place])  Immunization History  Administered Date(s) Administered  . Influenza,inj,Quad PF,6+ Mos 12/30/2014  . Janssen (J&J) SARS-COV-2 Vaccination 07/13/2019  . PPD Test 10/25/2015  . Pneumococcal Polysaccharide-23 09/21/2015  . Tdap 02/10/2013    Hypertension The problem is controlled. Pertinent negatives include no chest pain, headaches, palpitations or shortness of breath. There are no associated agents to hypertension. Past treatments include ACE inhibitors. The current treatment provides significant improvement. There is no history of kidney disease, CAD/MI, CVA, heart failure or PVD.  Diabetes He presents for his follow-up diabetic visit. He has type 2 diabetes mellitus. His disease course has been stable. There are no hypoglycemic associated symptoms. Pertinent negatives for hypoglycemia include no dizziness or headaches. Pertinent negatives for diabetes include no chest pain and no fatigue. Pertinent negatives for diabetic complications include no CVA or PVD. Current diabetic treatment includes oral agent (monotherapy). He is compliant with treatment all of the time. His weight is stable. An ACE inhibitor/angiotensin II receptor blocker is being taken. Eye exam is current.  Hyperlipidemia This is a chronic problem. The problem is controlled. Pertinent negatives include no chest pain, myalgias or shortness of breath. Current antihyperlipidemic treatment includes statins. The current treatment provides significant improvement of lipids.  OSA - on BiPap nightly with good  compliance.  No equipment issues.  No daytime somnolence or headaches. 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  No new sx or complaints.  His insurance is not covering it and he pays 70$ every 2 weeks.  He is planning to change insurance so will wait until the first of the year to do a PA.  Lab Results  Component Value Date   CREATININE 1.17 10/23/2019   BUN 19 10/23/2019   NA 141 10/23/2019   K 4.3 10/23/2019   CL 101 10/23/2019   CO2 26 10/23/2019   Lab Results  Component Value Date   CHOL 184 10/23/2019   HDL 34 (L) 10/23/2019   LDLCALC 126 (H) 10/23/2019   TRIG 130 10/23/2019   CHOLHDL 5.4 (H) 10/23/2019   No results found for: TSH Lab Results  Component Value Date   HGBA1C 6.6 (H) 10/23/2019   Lab Results  Component Value Date   WBC 10.8 10/23/2019   HGB 19.1 (H) 10/23/2019   HCT 55.8 (H) 10/23/2019   MCV 91 10/23/2019   PLT 164 10/23/2019   Lab Results  Component Value Date   ALT 34 10/23/2019   AST 25 10/23/2019   ALKPHOS 86 10/23/2019   BILITOT 0.5 10/23/2019     Review of Systems  Constitutional: Negative for appetite change, chills, diaphoresis, fatigue and unexpected weight change.  HENT: Negative for hearing loss and trouble swallowing.   Eyes: Negative for visual disturbance.  Respiratory: Negative for choking, shortness of breath and wheezing.   Cardiovascular: Negative for chest pain, palpitations and leg swelling.  Gastrointestinal: Negative for abdominal pain, blood in stool, constipation and diarrhea.  Genitourinary: Negative for difficulty urinating, dysuria, frequency, hematuria and urgency.  Musculoskeletal: Negative for arthralgias,  back pain and myalgias.  Skin: Negative for color change and rash.  Allergic/Immunologic: Negative for environmental allergies.  Neurological: Negative for dizziness, syncope and headaches.  Hematological: Negative for adenopathy.  Psychiatric/Behavioral: Negative for dysphoric mood and sleep disturbance.     Patient Active Problem List   Diagnosis Date Noted  . OSA (obstructive sleep apnea) 04/24/2016  . Environmental and seasonal allergies 09/21/2015  . Type II diabetes mellitus with complication ([place]) 87/56/4332  . Hyperlipidemia associated with type 2 diabetes mellitus ([place]) 12/30/2014  . Essential (primary) hypertension 12/29/2014  . Decreased libido 12/29/2014  . Decreased testosterone level 12/29/2014  . Hx of colonic polyps - sessile serrated 04/30/2014    Allergies  Allergen Reactions  . Benicar Hct [Olmesartan Medoxomil-Hctz] Hives and Swelling    Past Surgical History:  Procedure Laterality Date  . CHOLECYSTECTOMY, LAPAROSCOPIC  07/04/2016   [place]  . COLONOSCOPY  [id number]   2 sessile serrated polyps  . KNEE ARTHROSCOPY Left 2004   left torn meniscus  . TONSILLECTOMY  1971    Social History   Tobacco Use  . Smoking status: Current Every Day Smoker    Packs/day: 0.50  . Smokeless tobacco: Never Used  Vaping Use  . Vaping Use: Never used  Substance Use Topics  . Alcohol use: Yes    Alcohol/week: 3.0 standard drinks    Types: 3 Glasses of wine per week    Comment: occasional  . Drug use: No     Medication list has been reviewed and updated.  Current Meds  Medication Sig  . atorvastatin (LIPITOR) 10 MG tablet Take 1 tablet (10 mg total) by mouth daily.  Marland Kitchen glucose blood (ONE TOUCH ULTRA TEST) test strip   . lisinopril (ZESTRIL) 10 MG tablet Take 1 tablet (10 mg total) by mouth daily.  Marland Kitchen loratadine (CLARITIN) 10 MG tablet Take 10 mg by mouth daily.  . metFORMIN (GLUCOPHAGE-XR) 500 MG 24 hr tablet Take 1 tablet (500 mg total) by mouth 2 (two) times daily.  . Multiple Vitamins-Minerals (CENTRUM ADULTS PO) Take 1 tablet by mouth daily.  . Testosterone (ANDROGEL PUMP) 20.25 MG/ACT (1.62%) GEL APPLY 4 PUMPS TOPICALLY DAILY AS DIRECTED.    PHQ 2/9 Scores 02/13/2020 10/23/2019 11/23/2015  PHQ - 2 Score 0 0 0  PHQ- 9 Score 0 0 -    GAD 7 : Generalized  Anxiety Score 02/13/2020 10/23/2019  Nervous, Anxious, on Edge 0 0  Control/stop worrying 0 0  Worry too much - different things 0 0  Trouble relaxing 0 0  Restless 0 0  Easily annoyed or irritable 0 0  Afraid - awful might happen 0 0  Total GAD 7 Score 0 0  Anxiety Difficulty - Not difficult at all    BP Readings from Last 3 Encounters:  02/13/20 118/82  10/23/19 122/82  10/02/16 120/76    Physical Exam Vitals and nursing note reviewed.  Constitutional:      Appearance: Normal appearance. He is well-developed.  HENT:     Head: Normocephalic.     Right Ear: Tympanic membrane, ear canal and external ear normal.     Left Ear: Tympanic membrane, ear canal and external ear normal.     Nose: Nose normal.  Eyes:     Conjunctiva/sclera: Conjunctivae normal.     Pupils: Pupils are equal, round, and reactive to light.  Neck:     Thyroid: No thyromegaly.     Vascular: No carotid bruit.  Cardiovascular:     Rate  and Rhythm: Normal rate and regular rhythm.     Heart sounds: Normal heart sounds.  Pulmonary:     Effort: Pulmonary effort is normal.     Breath sounds: Normal breath sounds. No wheezing.  Chest:     Breasts:        Right: No mass.        Left: No mass.  Abdominal:     General: Bowel sounds are normal.     Palpations: Abdomen is soft.     Tenderness: There is no abdominal tenderness.  Musculoskeletal:        General: Normal range of motion.     Cervical back: Normal range of motion and neck supple.     Right lower leg: No edema.     Left lower leg: No edema.  Lymphadenopathy:     Cervical: No cervical adenopathy.  Skin:    General: Skin is warm and dry.     Capillary Refill: Capillary refill takes less than 2 seconds.  Neurological:     General: No focal deficit present.     Mental Status: He is alert and oriented to person, place, and time.     Deep Tendon Reflexes: Reflexes are normal and symmetric.  Psychiatric:        Mood and Affect: Mood normal.         Speech: Speech normal.        Behavior: Behavior normal.     Wt Readings from Last 3 Encounters:  02/13/20 226 lb (102.5 kg)  10/23/19 216 lb (98 kg)  10/02/16 234 lb (106.1 kg)    BP 118/82   Pulse 79   Temp 98.1 F (36.7 C) (Oral)   Ht 5\' 11"  (1.803 m)   Wt 226 lb (102.5 kg)   SpO2 99%   BMI 31.52 kg/m   Assessment and Plan: 1. Annual physical exam Exam is normal except for weight. Encourage regular exercise and appropriate dietary changes. - POCT urinalysis dipstick  2. Colon cancer screening Will refer after Jan 1 when insurance changes  3. Prostate cancer screening DRE deferred - PSA  4. Type II diabetes mellitus with complication (HCC) Clinically stable by exam and report without s/s of hypoglycemia. DM complicated by HTN and lipids. Tolerating medications well without side effects or other concerns. - Comprehensive metabolic panel - Hemoglobin A1c  5. Essential (primary) hypertension Clinically stable exam with well controlled BP. Tolerating medications without side effects at this time. Pt to continue current regimen and low sodium diet; benefits of regular exercise as able discussed. - CBC with Differential/Platelet  6. Hyperlipidemia associated with type 2 diabetes mellitus ([place]) Now back on statin therapy without side effects or concerns Check labs; continue current treatment - Lipid panel  7. Decreased testosterone level Continue topical Androgel Will do PA next year - may need to stop medication to obtain a low testosterone level. - Testosterone   Partially dictated using Editor, commissioning. Any errors are unintentional.  Halina Maidens, MD [facility]  02/13/2020

## 2020-02-14 LAB — CBC WITH DIFFERENTIAL/PLATELET
Basophils Absolute: 0.1 10*3/uL (ref 0.0–0.2)
Basos: 1 %
EOS (ABSOLUTE): 0.3 10*3/uL (ref 0.0–0.4)
Eos: 5 %
Hematocrit: 52.1 % — ABNORMAL HIGH (ref 37.5–51.0)
Hemoglobin: 17.8 g/dL — ABNORMAL HIGH (ref 13.0–17.7)
Immature Grans (Abs): 0 10*3/uL (ref 0.0–0.1)
Immature Granulocytes: 0 %
Lymphocytes Absolute: 1.9 10*3/uL (ref 0.7–3.1)
Lymphs: 25 %
MCH: 31.2 pg (ref 26.6–33.0)
MCHC: 34.2 g/dL (ref 31.5–35.7)
MCV: 91 fL (ref 79–97)
Monocytes Absolute: 0.5 10*3/uL (ref 0.1–0.9)
Monocytes: 7 %
Neutrophils Absolute: 4.8 10*3/uL (ref 1.4–7.0)
Neutrophils: 62 %
Platelets: 150 10*3/uL (ref 150–450)
RBC: 5.71 x10E6/uL (ref 4.14–5.80)
RDW: 12.9 % (ref 11.6–15.4)
WBC: 7.6 10*3/uL (ref 3.4–10.8)

## 2020-02-14 LAB — LIPID PANEL
Chol/HDL Ratio: 4.2 ratio (ref 0.0–5.0)
Cholesterol, Total: 171 mg/dL (ref 100–199)
HDL: 41 mg/dL (ref >39)
LDL Chol Calc (NIH): 112 mg/dL — ABNORMAL HIGH (ref 0–99)
Triglycerides: 97 mg/dL (ref 0–149)
VLDL Cholesterol Cal: 18 mg/dL (ref 5–40)

## 2020-02-14 LAB — COMPREHENSIVE METABOLIC PANEL
ALT: 26 IU/L (ref 0–44)
AST: 17 IU/L (ref 0–40)
Albumin/Globulin Ratio: 1.9 (ref 1.2–2.2)
Albumin: 4.3 g/dL (ref 3.8–4.9)
Alkaline Phosphatase: 92 IU/L (ref 44–121)
BUN/Creatinine Ratio: 14 (ref 9–20)
BUN: 15 mg/dL (ref 6–24)
Bilirubin Total: 0.3 mg/dL (ref 0.0–1.2)
CO2: 22 mmol/L (ref 20–29)
Calcium: 9.6 mg/dL (ref 8.7–10.2)
Chloride: 101 mmol/L (ref 96–106)
Creatinine, Ser: 1.1 mg/dL (ref 0.76–1.27)
GFR calc Af Amer: 86 mL/min/{1.73_m2} (ref >59)
GFR calc non Af Amer: 75 mL/min/{1.73_m2} (ref >59)
Globulin, Total: 2.3 g/dL (ref 1.5–4.5)
Glucose: 139 mg/dL — ABNORMAL HIGH (ref 65–99)
Potassium: 4.4 mmol/L (ref 3.5–5.2)
Sodium: 140 mmol/L (ref 134–144)
Total Protein: 6.6 g/dL (ref 6.0–8.5)

## 2020-02-14 LAB — TESTOSTERONE: Testosterone: 561 ng/dL (ref 264–916)

## 2020-02-14 LAB — HEMOGLOBIN A1C
Est. average glucose Bld gHb Est-mCnc: 163 mg/dL
Hgb A1c MFr Bld: 7.3 % — ABNORMAL HIGH (ref 4.8–5.6)

## 2020-02-14 LAB — PSA: Prostate Specific Ag, Serum: 0.8 ng/mL (ref 0.0–4.0)

## 2020-03-11 ENCOUNTER — Other Ambulatory Visit: Payer: Self-pay

## 2020-03-11 ENCOUNTER — Telehealth: Payer: Self-pay | Admitting: Internal Medicine

## 2020-03-11 DIAGNOSIS — I10 Essential (primary) hypertension: Secondary | ICD-10-CM

## 2020-03-11 DIAGNOSIS — E7800 Pure hypercholesterolemia, unspecified: Secondary | ICD-10-CM

## 2020-03-11 MED ORDER — ATORVASTATIN CALCIUM 10 MG PO TABS: 10.0000 mg | ORAL_TABLET | Freq: Every day | ORAL | 0 refills | Status: DC

## 2020-03-11 MED ORDER — LISINOPRIL 10 MG PO TABS: 10.0000 mg | ORAL_TABLET | Freq: Every day | ORAL | 0 refills | Status: DC

## 2020-03-11 NOTE — Telephone Encounter (Signed)
Called pt sent in lisinopril and atorvastatin. Pt stated that he was able to get RF on testosterone. Pt verbalized understanding.  KP

## 2020-03-11 NOTE — Telephone Encounter (Signed)
Medication Refill - Medication: atorvastatin (LIPITOR) 10 MG tablet   lisinopril (ZESTRIL) 10 MG tablet  metFORMIN (GLUCOPHAGE-XR) 500 MG 24 hr tablet  Testosterone (ANDROGEL PUMP) 20.25 MG/ACT (1.62%) GEL  Pt stated he requested medication during his 12.3.21 appt and has been checking with the pharmacies ever since/ Pt asked for these refills to be sent to pharmacy below asap / please advise    Has the patient contacted their pharmacy? Yes.   (Agent: If no, request that the patient contact the pharmacy for the refill.) (Agent: If yes, when and what did the pharmacy advise?) No Rx sent/ call PCP Preferred Pharmacy (with phone number or street name):  [facility] [place] - [place], [place] - [address] Phone:  [phone]  Fax:  [phone]       Agent: Please be advised that RX refills may take up to 3 business days. We ask that you follow-up with your pharmacy.

## 2020-06-10 ENCOUNTER — Ambulatory Visit (INDEPENDENT_AMBULATORY_CARE_PROVIDER_SITE_OTHER): Payer: BLUE CROSS/BLUE SHIELD | Admitting: Internal Medicine

## 2020-06-10 ENCOUNTER — Other Ambulatory Visit: Payer: Self-pay

## 2020-06-10 ENCOUNTER — Encounter: Payer: Self-pay | Admitting: Internal Medicine

## 2020-06-10 VITALS — BP 118/76 | HR 86 | Temp 98.3°F | Ht 71.0 in | Wt 228.0 lb

## 2020-06-10 DIAGNOSIS — N529 Male erectile dysfunction, unspecified: Secondary | ICD-10-CM | POA: Diagnosis not present

## 2020-06-10 DIAGNOSIS — R7989 Other specified abnormal findings of blood chemistry: Secondary | ICD-10-CM

## 2020-06-10 DIAGNOSIS — E118 Type 2 diabetes mellitus with unspecified complications: Secondary | ICD-10-CM

## 2020-06-10 DIAGNOSIS — E7800 Pure hypercholesterolemia, unspecified: Secondary | ICD-10-CM | POA: Diagnosis not present

## 2020-06-10 DIAGNOSIS — I10 Essential (primary) hypertension: Secondary | ICD-10-CM

## 2020-06-10 LAB — POCT GLYCOSYLATED HEMOGLOBIN (HGB A1C): Hemoglobin A1C: 6.8 % — AB (ref 4.0–5.6)

## 2020-06-10 MED ORDER — SILDENAFIL CITRATE 100 MG PO TABS: 100.0000 mg | ORAL_TABLET | Freq: Every day | ORAL | 0 refills | Status: DC | PRN

## 2020-06-10 MED ORDER — TESTOSTERONE 20.25 MG/ACT (1.62%) TD GEL: TRANSDERMAL | 5 refills | Status: DC

## 2020-06-10 MED ORDER — ATORVASTATIN CALCIUM 10 MG PO TABS: 10.0000 mg | ORAL_TABLET | Freq: Every day | ORAL | 0 refills | Status: DC

## 2020-06-10 MED ORDER — LISINOPRIL 10 MG PO TABS: 10.0000 mg | ORAL_TABLET | Freq: Every day | ORAL | 0 refills | Status: DC

## 2020-06-10 NOTE — Progress Notes (Signed)
Date:  06/10/2020   Name:  Benjamin Nelson   DOB:  07-Jun-1965   MRN:  [id number]   Chief Complaint: Hypertension and Diabetes  Diabetes He presents for his follow-up diabetic visit. He has type 2 diabetes mellitus. His disease course has been stable. Pertinent negatives for hypoglycemia include no headaches or tremors. Pertinent negatives for diabetes include no chest pain, no fatigue, no polydipsia and no polyuria. Current diabetic treatment includes oral agent (monotherapy). He is compliant with treatment all of the time. His weight is stable. He monitors blood glucose at home 1-2 x per day. His breakfast blood glucose is taken between 6-7 am. His breakfast blood glucose range is generally 130-140 mg/dl. An ACE inhibitor/angiotensin II receptor blocker is being taken.  Hypertension This is a chronic problem. The problem is controlled. Pertinent negatives include no chest pain, headaches, palpitations or shortness of breath. Past treatments include ACE inhibitors. The current treatment provides significant improvement.  Erectile Dysfunction This is a new problem. The current episode started more than 1 month ago. The problem has been gradually worsening since onset. The nature of his difficulty is maintaining erection. Pertinent negatives include no dysuria or hematuria. Nothing aggravates the symptoms.    Lab Results  Component Value Date   CREATININE 1.10 02/13/2020   BUN 15 02/13/2020   NA 140 02/13/2020   K 4.4 02/13/2020   CL 101 02/13/2020   CO2 22 02/13/2020   Lab Results  Component Value Date   CHOL 171 02/13/2020   HDL 41 02/13/2020   LDLCALC 112 (H) 02/13/2020   TRIG 97 02/13/2020   CHOLHDL 4.2 02/13/2020   No results found for: TSH Lab Results  Component Value Date   HGBA1C 6.8 (A) 06/10/2020   Lab Results  Component Value Date   WBC 7.6 02/13/2020   HGB 17.8 (H) 02/13/2020   HCT 52.1 (H) 02/13/2020   MCV 91 02/13/2020   PLT 150 02/13/2020   Lab Results   Component Value Date   ALT 26 02/13/2020   AST 17 02/13/2020   ALKPHOS 92 02/13/2020   BILITOT 0.3 02/13/2020     Review of Systems  Constitutional: Negative for appetite change, fatigue and unexpected weight change.  Eyes: Negative for visual disturbance.  Respiratory: Negative for cough, shortness of breath and wheezing.   Cardiovascular: Negative for chest pain, palpitations and leg swelling.  Gastrointestinal: Negative for abdominal pain and blood in stool.  Endocrine: Negative for polydipsia and polyuria.  Genitourinary: Negative for dysuria, hematuria, penile pain and testicular pain.  Skin: Negative for color change and rash.  Neurological: Negative for tremors, numbness and headaches.  Psychiatric/Behavioral: Negative for dysphoric mood.    Patient Active Problem List   Diagnosis Date Noted  . OSA (obstructive sleep apnea) 04/24/2016  . Environmental and seasonal allergies 09/21/2015  . Type II diabetes mellitus with complication ([place]) 34/74/2595  . Hyperlipidemia associated with type 2 diabetes mellitus ([place]) 12/30/2014  . Essential (primary) hypertension 12/29/2014  . Decreased libido 12/29/2014  . Decreased testosterone level 12/29/2014  . Hx of colonic polyps - sessile serrated 04/30/2014    Allergies  Allergen Reactions  . Benicar Hct [Olmesartan Medoxomil-Hctz] Hives and Swelling    Past Surgical History:  Procedure Laterality Date  . CHOLECYSTECTOMY, LAPAROSCOPIC  07/04/2016   [place]  . COLONOSCOPY  [id number]   2 sessile serrated polyps  . KNEE ARTHROSCOPY Left 2004   left torn meniscus  . TONSILLECTOMY  1971  Social History   Tobacco Use  . Smoking status: Current Every Day Smoker    Packs/day: 0.50    Years: 3.00    Pack years: 1.50    Types: Cigarettes    Start date: 04/13/2017  . Smokeless tobacco: Never Used  Vaping Use  . Vaping Use: Never used  Substance Use Topics  . Alcohol use: Yes    Alcohol/week: 3.0 standard drinks     Types: 3 Glasses of wine per week    Comment: occasional  . Drug use: No     Medication list has been reviewed and updated.  Current Meds  Medication Sig  . glucose blood test strip   . loratadine (CLARITIN) 10 MG tablet Take 10 mg by mouth daily.  . metFORMIN (GLUCOPHAGE-XR) 500 MG 24 hr tablet Take 1 tablet (500 mg total) by mouth 2 (two) times daily.  . Multiple Vitamins-Minerals (CENTRUM ADULTS PO) Take 1 tablet by mouth daily.  . sildenafil (VIAGRA) 100 MG tablet Take 1 tablet (100 mg total) by mouth daily as needed for erectile dysfunction.  . [DISCONTINUED] atorvastatin (LIPITOR) 10 MG tablet Take 1 tablet (10 mg total) by mouth daily.  . [DISCONTINUED] lisinopril (ZESTRIL) 10 MG tablet Take 1 tablet (10 mg total) by mouth daily.  . [DISCONTINUED] Testosterone (ANDROGEL PUMP) 20.25 MG/ACT (1.62%) GEL APPLY 4 PUMPS TOPICALLY DAILY AS DIRECTED.    PHQ 2/9 Scores 06/10/2020 02/13/2020 10/23/2019 11/23/2015  PHQ - 2 Score 0 0 0 0  PHQ- 9 Score 0 0 0 -    GAD 7 : Generalized Anxiety Score 06/10/2020 02/13/2020 10/23/2019  Nervous, Anxious, on Edge 0 0 0  Control/stop worrying 0 0 0  Worry too much - different things 0 0 0  Trouble relaxing 0 0 0  Restless 0 0 0  Easily annoyed or irritable 0 0 0  Afraid - awful might happen 0 0 0  Total GAD 7 Score 0 0 0  Anxiety Difficulty Not difficult at all - Not difficult at all    BP Readings from Last 3 Encounters:  06/10/20 118/76  02/13/20 118/82  10/23/19 122/82    Physical Exam Vitals and nursing note reviewed.  Constitutional:      General: He is not in acute distress.    Appearance: He is well-developed.  HENT:     Head: Normocephalic and atraumatic.  Neck:     Vascular: No carotid bruit.  Cardiovascular:     Rate and Rhythm: Normal rate and regular rhythm.     Pulses: Normal pulses.  Pulmonary:     Effort: Pulmonary effort is normal. No respiratory distress.     Breath sounds: No wheezing or rhonchi.   Musculoskeletal:     Cervical back: Normal range of motion.     Right lower leg: No edema.     Left lower leg: No edema.  Lymphadenopathy:     Cervical: No cervical adenopathy.  Skin:    General: Skin is warm and dry.     Findings: No rash.  Neurological:     Mental Status: He is alert and oriented to person, place, and time.  Psychiatric:        Mood and Affect: Mood normal.        Behavior: Behavior normal.     Wt Readings from Last 3 Encounters:  06/10/20 228 lb (103.4 kg)  02/13/20 226 lb (102.5 kg)  10/23/19 216 lb (98 kg)    BP 118/76   Pulse 86  Temp 98.3 F (36.8 C) (Oral)   Ht 5\' 11"  (1.803 m)   Wt 228 lb (103.4 kg)   SpO2 98%   BMI 31.80 kg/m   Assessment and Plan: 1. Type II diabetes mellitus with complication (HCC) Clinically stable by exam and report without s/s of hypoglycemia. DM complicated by HTN. Tolerating medications - metformin once a day -  well without side effects or other concerns. He did not tolerate metformin twice a day due to joint pain. Continue metformin once a day only - POCT HgB A1C - down to 6.8.  2. Essential (primary) hypertension Clinically stable exam with well controlled BP. Tolerating medications without side effects at this time. Pt to continue current regimen and low sodium diet; benefits of regular exercise as able discussed. - lisinopril (ZESTRIL) 10 MG tablet; Take 1 tablet (10 mg total) by mouth daily.  Dispense: 90 tablet; Refill: 0  3. Erectile dysfunction, unspecified erectile dysfunction type Start Viagra 50 mg prn - the usual precautions given - sildenafil (VIAGRA) 100 MG tablet; Take 1 tablet (100 mg total) by mouth daily as needed for erectile dysfunction.  Dispense: 10 tablet; Refill: 0  4. Elevated cholesterol - atorvastatin (LIPITOR) 10 MG tablet; Take 1 tablet (10 mg total) by mouth daily.  Dispense: 90 tablet; Refill: 0  5. Decreased testosterone level Labs due next visit - Testosterone (ANDROGEL  PUMP) 20.25 MG/ACT (1.62%) GEL; APPLY 4 PUMPS TOPICALLY DAILY AS DIRECTED.  Dispense: 150 g; Refill: 5   Partially dictated using Editor, commissioning. Any errors are unintentional.  Halina Maidens, MD [facility]  06/10/2020

## 2020-09-09 ENCOUNTER — Other Ambulatory Visit: Payer: Self-pay | Admitting: Internal Medicine

## 2020-09-09 DIAGNOSIS — I10 Essential (primary) hypertension: Secondary | ICD-10-CM

## 2020-09-09 DIAGNOSIS — E7800 Pure hypercholesterolemia, unspecified: Secondary | ICD-10-CM

## 2020-09-09 DIAGNOSIS — N529 Male erectile dysfunction, unspecified: Secondary | ICD-10-CM

## 2020-09-09 NOTE — Telephone Encounter (Signed)
Future visit in 4 weeks . Last labs 02/13/20

## 2020-09-30 ENCOUNTER — Other Ambulatory Visit: Payer: Self-pay | Admitting: Internal Medicine

## 2020-09-30 DIAGNOSIS — E118 Type 2 diabetes mellitus with unspecified complications: Secondary | ICD-10-CM

## 2020-09-30 NOTE — Telephone Encounter (Signed)
  Notes to clinic:  Requested script has expired  Review for continued use and refill    Requested Prescriptions  Pending Prescriptions Disp Refills   metFORMIN (GLUCOPHAGE-XR) 500 MG 24 hr tablet [Pharmacy Med Name: METFORMIN HCL ER 500 MG TAB] 180 tablet 0    Sig: TAKE 1 TABLET BY MOUTH TWICE DAILY      Endocrinology:  Diabetes - Biguanides Passed - 09/30/2020 10:24 AM      Passed - Cr in normal range and within 360 days    Creatinine, Ser  Date Value Ref Range Status  02/13/2020 1.10 0.76 - 1.27 mg/dL Final          Passed - HBA1C is between 0 and 7.9 and within 180 days    Hemoglobin A1C  Date Value Ref Range Status  06/10/2020 6.8 (A) 4.0 - 5.6 % Final   Hgb A1c MFr Bld  Date Value Ref Range Status  02/13/2020 7.3 (H) 4.8 - 5.6 % Final    Comment:             Prediabetes: 5.7 - 6.4          Diabetes: >6.4          Glycemic control for adults with diabetes: <7.0           Passed - eGFR in normal range and within 360 days    GFR calc Af Amer  Date Value Ref Range Status  02/13/2020 86 >59 mL/min/1.73 Final    Comment:    **In accordance with recommendations from the NKF-ASN Task force,**   [facility] is in the process of updating its eGFR calculation to the   2021 CKD-EPI creatinine equation that estimates kidney function   without a race variable.    GFR calc non Af Amer  Date Value Ref Range Status  02/13/2020 75 >59 mL/min/1.73 Final          Passed - Valid encounter within last 6 months    Recent Outpatient Visits           3 months ago Type II diabetes mellitus with complication [facility])   [facility] Glean Hess, MD   7 months ago Annual physical exam   [facility] Glean Hess, MD   11 months ago Essential (primary) hypertension   [facility] Glean Hess, MD   3 years ago Annual physical exam   [facility] Glean Hess, MD   4 years ago Pharyngitis, unspecified etiology   [facility] Glean Hess, MD       Future Appointments             In 1 week Army Melia Jesse Sans, MD [facility] [place]   In 4 months Army Melia Jesse Sans, MD [facility]

## 2020-10-07 ENCOUNTER — Ambulatory Visit (INDEPENDENT_AMBULATORY_CARE_PROVIDER_SITE_OTHER): Payer: BLUE CROSS/BLUE SHIELD | Admitting: Internal Medicine

## 2020-10-07 ENCOUNTER — Encounter: Payer: Self-pay | Admitting: Internal Medicine

## 2020-10-07 ENCOUNTER — Other Ambulatory Visit: Payer: Self-pay

## 2020-10-07 VITALS — BP 118/80 | HR 73 | Temp 98.0°F | Ht 71.0 in | Wt 221.0 lb

## 2020-10-07 DIAGNOSIS — I10 Essential (primary) hypertension: Secondary | ICD-10-CM | POA: Diagnosis not present

## 2020-10-07 DIAGNOSIS — E118 Type 2 diabetes mellitus with unspecified complications: Secondary | ICD-10-CM

## 2020-10-07 DIAGNOSIS — R7989 Other specified abnormal findings of blood chemistry: Secondary | ICD-10-CM | POA: Diagnosis not present

## 2020-10-07 NOTE — Progress Notes (Signed)
Date:  10/07/2020   Name:  Benjamin Nelson   DOB:  [birth year]   MRN:  [id number]   Chief Complaint: Diabetes (Last BS yesterday 51) and Hypertension  Diabetes He presents for his follow-up diabetic visit. He has type 2 diabetes mellitus. His disease course has been stable. Pertinent negatives for hypoglycemia include no headaches or tremors. Pertinent negatives for diabetes include no chest pain, no fatigue, no polydipsia and no polyuria. Pertinent negatives for diabetic complications include no CVA. Current diabetic treatment includes oral agent (monotherapy) (metformin). He is compliant with treatment all of the time. He monitors blood glucose at home 3-4 x per week. His breakfast blood glucose is taken between 6-7 am. His breakfast blood glucose range is generally 110-130 mg/dl. An ACE inhibitor/angiotensin II receptor blocker is being taken. Eye exam is not current.  Hypertension This is a chronic problem. The problem is controlled. Pertinent negatives include no chest pain, headaches, palpitations or shortness of breath. Past treatments include ACE inhibitors. There is no history of kidney disease, CAD/MI or CVA.  Testosterone def - on topical supplementation.    Lab Results  Component Value Date   CREATININE 1.10 02/13/2020   BUN 15 02/13/2020   NA 140 02/13/2020   K 4.4 02/13/2020   CL 101 02/13/2020   CO2 22 02/13/2020   Lab Results  Component Value Date   CHOL 171 02/13/2020   HDL 41 02/13/2020   LDLCALC 112 (H) 02/13/2020   TRIG 97 02/13/2020   CHOLHDL 4.2 02/13/2020   No results found for: TSH Lab Results  Component Value Date   HGBA1C 6.8 (A) 06/10/2020   Lab Results  Component Value Date   WBC 7.6 02/13/2020   HGB 17.8 (H) 02/13/2020   HCT 52.1 (H) 02/13/2020   MCV 91 02/13/2020   PLT 150 02/13/2020   Lab Results  Component Value Date   ALT 26 02/13/2020   AST 17 02/13/2020   ALKPHOS 92 02/13/2020   BILITOT 0.3 02/13/2020   Lab Results  Component Value  Date   TESTOSTERONE 561 02/13/2020    Review of Systems  Constitutional:  Negative for appetite change, fatigue and unexpected weight change.  Eyes:  Negative for visual disturbance.  Respiratory:  Negative for cough, shortness of breath and wheezing.   Cardiovascular:  Negative for chest pain, palpitations and leg swelling.  Gastrointestinal:  Negative for abdominal pain and blood in stool.  Endocrine: Negative for polydipsia and polyuria.  Genitourinary:  Negative for dysuria and hematuria.  Skin:  Negative for color change and rash.  Neurological:  Negative for tremors, numbness and headaches.  Psychiatric/Behavioral:  Negative for dysphoric mood.    Patient Active Problem List   Diagnosis Date Noted   Erectile dysfunction 06/10/2020   OSA (obstructive sleep apnea) 04/24/2016   Environmental and seasonal allergies 09/21/2015   Type II diabetes mellitus with complication ([place]) 0000000   Hyperlipidemia associated with type 2 diabetes mellitus ([place]) 12/30/2014   Essential (primary) hypertension 12/29/2014   Decreased libido 12/29/2014   Decreased testosterone level 12/29/2014   Hx of colonic polyps - sessile serrated 04/30/2014    Allergies  Allergen Reactions   Benicar Hct [Olmesartan Medoxomil-Hctz] Hives and Swelling    Past Surgical History:  Procedure Laterality Date   CHOLECYSTECTOMY, LAPAROSCOPIC  07/04/2016   [place]  [id number]   2 sessile serrated polyps   KNEE ARTHROSCOPY Left 2004   left torn meniscus   TONSILLECTOMY  1971  Social History   Tobacco Use   Smoking status: Every Day    Packs/day: 0.50    Years: 3.00    Pack years: 1.50    Types: Cigarettes    Start date: 04/13/2017   Smokeless tobacco: Never  Vaping Use   Vaping Use: Never used  Substance Use Topics   Alcohol use: Yes    Alcohol/week: 3.0 standard drinks    Types: 3 Glasses of wine per week    Comment: occasional   Drug use: No     Medication list has been  reviewed and updated.  Current Meds  Medication Sig   atorvastatin (LIPITOR) 10 MG tablet TAKE 1 TABLET BY MOUTH ONCE DAILY.   glucose blood test strip    lisinopril (ZESTRIL) 10 MG tablet TAKE 1 TABLET BY MOUTH ONCE DAILY.   loratadine (CLARITIN) 10 MG tablet Take 10 mg by mouth daily.   metFORMIN (GLUCOPHAGE-XR) 500 MG 24 hr tablet TAKE 1 TABLET BY MOUTH TWICE DAILY (Patient taking differently: daily.)   Multiple Vitamins-Minerals (CENTRUM ADULTS PO) Take 1 tablet by mouth daily.   Olmesartan Medoxomil (BENICAR PO) Benicar   sildenafil (VIAGRA) 100 MG tablet TAKE (1) TABLET BY MOUTH ONCE DAILY AS NEEDED FOR ERECTILE DYSFUNCTION.   Testosterone (ANDROGEL PUMP) 20.25 MG/ACT (1.62%) GEL APPLY 4 PUMPS TOPICALLY DAILY AS DIRECTED.    PHQ 2/9 Scores 10/07/2020 06/10/2020 02/13/2020 10/23/2019  PHQ - 2 Score 0 0 0 0  PHQ- 9 Score 0 0 0 0    GAD 7 : Generalized Anxiety Score 10/07/2020 06/10/2020 02/13/2020 10/23/2019  Nervous, Anxious, on Edge 0 0 0 0  Control/stop worrying 0 0 0 0  Worry too much - different things 0 0 0 0  Trouble relaxing 0 0 0 0  Restless 0 0 0 0  Easily annoyed or irritable 0 0 0 0  Afraid - awful might happen 0 0 0 0  Total GAD 7 Score 0 0 0 0  Anxiety Difficulty - Not difficult at all - Not difficult at all    BP Readings from Last 3 Encounters:  10/07/20 118/80  06/10/20 118/76  02/13/20 118/82    Physical Exam Vitals and nursing note reviewed.  Constitutional:      General: He is not in acute distress.    Appearance: He is well-developed.  HENT:     Head: Normocephalic and atraumatic.  Cardiovascular:     Rate and Rhythm: Normal rate and regular rhythm.     Pulses: Normal pulses.     Heart sounds: No murmur heard. Pulmonary:     Effort: Pulmonary effort is normal. No respiratory distress.     Breath sounds: No wheezing or rhonchi.  Musculoskeletal:     Cervical back: Normal range of motion.     Right lower leg: No edema.     Left lower leg: No  edema.  Lymphadenopathy:     Cervical: No cervical adenopathy.  Skin:    General: Skin is warm and dry.     Findings: No rash.  Neurological:     Mental Status: He is alert and oriented to person, place, and time.  Psychiatric:        Mood and Affect: Mood normal.        Behavior: Behavior normal.    Wt Readings from Last 3 Encounters:  10/07/20 221 lb (100.2 kg)  06/10/20 228 lb (103.4 kg)  02/13/20 226 lb (102.5 kg)    BP 118/80   Pulse 73  Temp 98 F (36.7 C) (Oral)   Ht '5\' 11"'$  (1.803 m)   Wt 221 lb (100.2 kg)   SpO2 98%   BMI 30.82 kg/m   Assessment and Plan: 1. Type II diabetes mellitus with complication (HCC) Clinically stable by exam and report without s/s of hypoglycemia. Metformin tolerated well.  He is due for eye exam. DM complicated by hypertension and dyslipidemia. Tolerating medications well without side effects or other concerns. - Hemoglobin A1c  2. Essential (primary) hypertension Clinically stable exam with well controlled BP. Tolerating medications without side effects at this time. Pt to continue current regimen and low sodium diet; benefits of regular exercise as able discussed. - CBC with Differential/Platelet  3. Decreased testosterone level On topical supplementation and doing well.  No skin irritation.  Good energy, mood and libido. - Hepatic function panel - Testosterone, Free, Total, SHBG   Partially dictated using Editor, commissioning. Any errors are unintentional.  Halina Maidens, MD [facility]  10/07/2020

## 2020-10-07 NOTE — Patient Instructions (Signed)
Scheduled Colonoscopy  Get eye exam done

## 2020-10-09 LAB — CBC WITH DIFFERENTIAL/PLATELET
Basophils Absolute: 0.1 10*3/uL (ref 0.0–0.2)
Basos: 1 %
EOS (ABSOLUTE): 0.3 10*3/uL (ref 0.0–0.4)
Eos: 3 %
Hematocrit: 51 % (ref 37.5–51.0)
Hemoglobin: 17.3 g/dL (ref 13.0–17.7)
Immature Grans (Abs): 0 10*3/uL (ref 0.0–0.1)
Immature Granulocytes: 0 %
Lymphocytes Absolute: 2.3 10*3/uL (ref 0.7–3.1)
Lymphs: 25 %
MCH: 31.5 pg (ref 26.6–33.0)
MCHC: 33.9 g/dL (ref 31.5–35.7)
MCV: 93 fL (ref 79–97)
Monocytes Absolute: 0.7 10*3/uL (ref 0.1–0.9)
Monocytes: 7 %
Neutrophils Absolute: 5.8 10*3/uL (ref 1.4–7.0)
Neutrophils: 64 %
Platelets: 166 10*3/uL (ref 150–450)
RBC: 5.49 x10E6/uL (ref 4.14–5.80)
RDW: 12.3 % (ref 11.6–15.4)
WBC: 9.1 10*3/uL (ref 3.4–10.8)

## 2020-10-09 LAB — HEPATIC FUNCTION PANEL
ALT: 34 IU/L (ref 0–44)
AST: 24 IU/L (ref 0–40)
Albumin: 4.2 g/dL (ref 3.8–4.9)
Alkaline Phosphatase: 113 IU/L (ref 44–121)
Bilirubin Total: 0.6 mg/dL (ref 0.0–1.2)
Bilirubin, Direct: 0.15 mg/dL (ref 0.00–0.40)
Total Protein: 6.4 g/dL (ref 6.0–8.5)

## 2020-10-09 LAB — TESTOSTERONE, FREE, TOTAL, SHBG
Sex Hormone Binding: 18.8 nmol/L — ABNORMAL LOW (ref 19.3–76.4)
Testosterone, Free: 5.5 pg/mL — ABNORMAL LOW (ref 7.2–24.0)
Testosterone: 161 ng/dL — ABNORMAL LOW (ref 264–916)

## 2020-10-09 LAB — HEMOGLOBIN A1C
Est. average glucose Bld gHb Est-mCnc: 189 mg/dL
Hgb A1c MFr Bld: 8.2 % — ABNORMAL HIGH (ref 4.8–5.6)

## 2020-10-10 ENCOUNTER — Other Ambulatory Visit: Payer: Self-pay | Admitting: Internal Medicine

## 2020-10-10 DIAGNOSIS — E118 Type 2 diabetes mellitus with unspecified complications: Secondary | ICD-10-CM

## 2020-10-10 MED ORDER — DAPAGLIFLOZIN PROPANEDIOL 10 MG PO TABS
10.0000 mg | ORAL_TABLET | Freq: Every day | ORAL | 1 refills | Status: DC
Start: 2020-10-10 — End: 2021-01-12

## 2021-01-12 ENCOUNTER — Other Ambulatory Visit: Payer: Self-pay | Admitting: Internal Medicine

## 2021-01-12 DIAGNOSIS — E7800 Pure hypercholesterolemia, unspecified: Secondary | ICD-10-CM

## 2021-01-12 DIAGNOSIS — I10 Essential (primary) hypertension: Secondary | ICD-10-CM

## 2021-01-12 DIAGNOSIS — E118 Type 2 diabetes mellitus with unspecified complications: Secondary | ICD-10-CM

## 2021-01-12 DIAGNOSIS — R7989 Other specified abnormal findings of blood chemistry: Secondary | ICD-10-CM

## 2021-01-12 NOTE — Telephone Encounter (Signed)
Requested medication (s) are due for refill today: yes  Requested medication (s) are on the active medication list: yes  Last refill:  06/10/20 #150g 5 refills  Future visit scheduled: yes in 1 month  Notes to clinic:  medication not assigned to a protocol     Requested Prescriptions  Pending Prescriptions Disp Refills   Testosterone 1.62 % GEL [Pharmacy Med Name: TESTOSTERONE 1.62% GEL PUMP] 176 g 0    Sig: APPLY 4 PUMPS TOPICALLY DAILY AS DIRECTED.     Off-Protocol Failed - 01/12/2021 10:59 AM      Failed - Medication not assigned to a protocol, review manually.      Passed - Valid encounter within last 12 months    Recent Outpatient Visits           3 months ago Type II diabetes mellitus with complication [facility])   [facility], [facility]   7 months ago Type II diabetes mellitus with complication [facility])   [facility], [facility]   11 months ago Annual physical exam   [facility], [facility]   1 year ago Essential (primary) hypertension   [facility] Glean Hess, MD   4 years ago Annual physical exam   [facility] Glean Hess, MD       Future Appointments             In 1 month Army Melia Jesse Sans, MD [facility]            Signed Prescriptions Disp Refills   FARXIGA 10 MG TABS tablet 90 tablet 0    Sig: TAKE (1) TABLET BY MOUTH DAILY BEFORE BREAKFAST.     Endocrinology:  Diabetes - SGLT2 Inhibitors Failed - 01/12/2021 10:59 AM      Failed - LDL in normal range and within 360 days    LDL Chol Calc (NIH)  Date Value Ref Range Status  02/13/2020 112 (H) 0 - 99 mg/dL Final          Failed - HBA1C is between 0 and 7.9 and within 180 days    Hgb A1c MFr Bld  Date Value Ref Range Status  10/07/2020 8.2 (H) 4.8 - 5.6 % Final    Comment:             Prediabetes: 5.7 - 6.4          Diabetes: >6.4          Glycemic control for adults with diabetes: <7.0            Passed - Cr in normal range and within 360 days    Creatinine, Ser  Date Value Ref Range Status  02/13/2020 1.10 0.76 - 1.27 mg/dL Final          Passed - eGFR in normal range and within 360 days    GFR calc Af Amer  Date Value Ref Range Status  02/13/2020 86 >59 mL/min/1.73 Final    Comment:    **In accordance with recommendations from the NKF-ASN Task force,**   [facility] is in the process of updating its eGFR calculation to the   2021 CKD-EPI creatinine equation that estimates kidney function   without a race variable.    GFR calc non Af Amer  Date Value Ref Range Status  02/13/2020 75 >59 mL/min/1.73 Final          Passed - Valid encounter within last 6  months    Recent Outpatient Visits           3 months ago Type II diabetes mellitus with complication [facility])   [facility], [facility]   7 months ago Type II diabetes mellitus with complication [facility])   [facility]   11 months ago Annual physical exam   [facility] Glean Hess, MD   1 year ago Essential (primary) hypertension   [facility] Glean Hess, MD   4 years ago Annual physical exam   [facility]an Hess, MD       Future Appointments             In 1 month Army Melia Jesse Sans, MD [facility]             atorvastatin (LIPITOR) 10 MG tablet 90 tablet 2    Sig: TAKE 1 TABLET BY MOUTH ONCE DAILY.     Cardiovascular:  Antilipid - Statins Failed - 01/12/2021 10:59 AM      Failed - LDL in normal range and within 360 days    LDL Chol Calc (NIH)  Date Value Ref Range Status  02/13/2020 112 (H) 0 - 99 mg/dL Final          Passed - Total Cholesterol in normal range and within 360 days    Cholesterol, Total  Date Value Ref Range Status  02/13/2020 171 100 - 199 mg/dL Final          Passed - HDL in normal range and within 360 days    HDL  Date Value Ref Range Status  02/13/2020  41 >39 mg/dL Final          Passed - Triglycerides in normal range and within 360 days    Triglycerides  Date Value Ref Range Status  02/13/2020 97 0 - 149 mg/dL Final          Passed - Patient is not pregnant      Passed - Valid encounter within last 12 months    Recent Outpatient Visits           3 months ago Type II diabetes mellitus with complication [facility])   [facility], [facility]   7 months ago Type II diabetes mellitus with complication [facility])   [facility] Gle[facility], [facility]   11 months ago Annual physical exam   [facility] Glean Hess, MD   1 year ago Essential (primary) hypertension   [place] Laura H, MD   4 years ago Annual physical exam   [facility] Glean Hess, MD       Future Appointments             In 1 month Glean Hess, MD [facility]             lisinopril (ZESTRIL) 10 MG tablet 90 tablet 0    Sig: TAKE 1 TABLET BY MOUTH ONCE DAILY.     Cardiovascular:  ACE Inhibitors Failed - 01/12/2021 10:59 AM      Failed - Cr in normal range and within 180 days    Creatinine, Ser  Date Value Ref Range Status  02/13/2020 1.10 0.76 - 1.27 mg/dL Final          Failed - K in normal range and within 180 days    Potassium  Date Value Ref Range Status  02/13/2020 4.4 3.5 - 5.2 mmol/L Final          Passed - Patient is not pregnant      Passed - Last BP in normal range    BP Readings from Last 1 Encounters:  10/07/20 118/80          Passed - Valid encounter within last 6 months    Recent Outpatient Visits           3 months ago Type II diabetes mellitus with complication [facility])   [facility]   7 months ago Type II diabetes mellitus with complication [facility])   [facility]   11 months ago Annual physical exam   [facility] Glean Hess, MD   1 year ago Essential  (primary) hypertension   [facility], [facility]   4 years ago Annual physical exam   [facility] Glean Hess, MD       Future Appointments             In 1 month Army Melia, Jesse Sans, MD [facility]             metFORMIN (GLUCOPHAGE-XR) 500 MG 24 hr tablet 180 tablet 0    Sig: TAKE 1 TABLET BY MOUTH TWICE DAILY     Endocrinology:  Diabetes - Biguanides Failed - 01/12/2021 10:59 AM      Failed - HBA1C is between 0 and 7.9 and within 180 days    Hgb A1c MFr Bld  Date Value Ref Range Status  10/07/2020 8.2 (H) 4.8 - 5.6 % Final    Comment:             Prediabetes: 5.7 - 6.4          Diabetes: >6.4          Glycemic control for adults with diabetes: <7.0           Passed - Cr in normal range and within 360 days    Creatinine, Ser  Date Value Ref Range Status  02/13/2020 1.10 0.76 - 1.27 mg/dL Final          Passed - eGFR in normal range and within 360 days    GFR calc Af Amer  Date Value Ref Range Status  02/13/2020 86 >59 mL/min/1.73 Final    Comment:    **In accordance with recommendations from the NKF-ASN Task force,**   [facility] is in the process of updating its eGFR calculation to the   2021 CKD-EPI creatinine equation that estimates kidney function   without a race variable.    GFR calc non Af Amer  Date Value Ref Range Status  02/13/2020 75 >59 mL/min/1.73 Final          Passed - Valid encounter within last 6 months    Recent Outpatient Visits           3 months ago Type II diabetes mellitus with complication [facility])   [facility]   7 months ago Type II diabetes mellitus with complication [facility])   [facility], [facility]   11 months ago Annual physical exam   [facility] Glean Hess, MD   1 year ago Essential (primary) hypertension   [facility] Glean Hess, MD   4 years ago Annual physical exam  [facility] Glean Hess, MD       Future Appointments             In 1 month Army Melia Jesse Sans, MD [facility]

## 2021-01-12 NOTE — Telephone Encounter (Signed)
Requested Prescriptions  Pending Prescriptions Disp Refills  . FARXIGA 10 MG TABS tablet [Pharmacy Med Name: FARXIGA 10 MG TABLET] 90 tablet 0    Sig: TAKE (1) TABLET BY MOUTH DAILY BEFORE BREAKFAST.     Endocrinology:  Diabetes - SGLT2 Inhibitors Failed - 01/12/2021 10:59 AM      Failed - LDL in normal range and within 360 days    LDL Chol Calc (NIH)  Date Value Ref Range Status  02/13/2020 112 (H) 0 - 99 mg/dL Final         Failed - HBA1C is between 0 and 7.9 and within 180 days    Hgb A1c MFr Bld  Date Value Ref Range Status  10/07/2020 8.2 (H) 4.8 - 5.6 % Final    Comment:             Prediabetes: 5.7 - 6.4          Diabetes: >6.4          Glycemic control for adults with diabetes: <7.0          Passed - Cr in normal range and within 360 days    Creatinine, Ser  Date Value Ref Range Status  02/13/2020 1.10 0.76 - 1.27 mg/dL Final         Passed - eGFR in normal range and within 360 days    GFR calc Af Amer  Date Value Ref Range Status  02/13/2020 86 >59 mL/min/1.73 Final    Comment:    **In accordance with recommendations from the NKF-ASN Task force,**   [facility] is in the process of updating its eGFR calculation to the   2021 CKD-EPI creatinine equation that estimates kidney function   without a race variable.    GFR calc non Af Amer  Date Value Ref Range Status  02/13/2020 75 >59 mL/min/1.73 Final         Passed - Valid encounter within last 6 months    Recent Outpatient Visits          3 months ago Type II diabetes mellitus with complication [facility])   [facility] Gle[facility], [facility]   7 months ago Type II diabetes mellitus with complication [facility])   [facility]   11 months ago Annual physical exam   [facility] Glean Hess, MD   1 year ago Essential (primary) hypertension   [facility], [facility]   4 years ago Annual physical exam   [facility] Glean Hess, MD       Future Appointments            In 1 month Army Melia Jesse Sans, MD [facility] [place]           . atorvastatin (LIPITOR) 10 MG tablet [Pharmacy Med Name: ATORVASTATIN 10 MG TABLET] 90 tablet 2    Sig: TAKE 1 TABLET BY MOUTH ONCE DAILY.     Cardiovascular:  Antilipid - Statins Failed - 01/12/2021 10:59 AM      Failed - LDL in normal range and within 360 days    LDL Chol Calc (NIH)  Date Value Ref Range Status  02/13/2020 112 (H) 0 - 99 mg/dL Final         Passed - Total Cholesterol in normal range and within 360 days    Cholesterol, Total  Date Value Ref Range Status  02/13/2020 171 100 - 199 mg/dL Final  Passed - HDL in normal range and within 360 days    HDL  Date Value Ref Range Status  02/13/2020 41 >39 mg/dL Final         Passed - Triglycerides in normal range and within 360 days    Triglycerides  Date Value Ref Range Status  02/13/2020 97 0 - 149 mg/dL Final         Passed - Patient is not pregnant      Passed - Valid encounter within last 12 months    Recent Outpatient Visits          3 months ago Type II diabetes mellitus with complication [facility])   [facility], [facility]   7 months ago Type II diabetes mellitus with complication [facility])   [facility] Gle[facility], [facility]   11 months ago Annual physical exam   [facility] Glean Hess, MD   1 year ago Essential (primary) hypertension   [facility] Glean Hess, MD   4 years ago Annual physical exam   [facility] Glean Hess, MD      Future Appointments            In 1 month Army Melia Jesse Sans, MD [facility] [place]           . lisinopril (ZESTRIL) 10 MG tablet [Pharmacy Med Name: LISINOPRIL 10 MG TABLET] 90 tablet 0    Sig: TAKE 1 TABLET BY MOUTH ONCE DAILY.     Cardiovascular:  ACE Inhibitors Failed - 01/12/2021 10:59 AM      Failed - Cr in normal range and within 180 days    Creatinine, Ser   Date Value Ref Range Status  02/13/2020 1.10 0.76 - 1.27 mg/dL Final         Failed - K in normal range and within 180 days    Potassium  Date Value Ref Range Status  02/13/2020 4.4 3.5 - 5.2 mmol/L Final         Passed - Patient is not pregnant      Passed - Last BP in normal range    BP Readings from Last 1 Encounters:  10/07/20 118/80         Passed - Valid encounter within last 6 months    Recent Outpatient Visits          3 months ago Type II diabetes mellitus with complication [facility])   [facility] Gle[facility], MD   7 months ago Type II diabetes mellitus with complication [facility])   [facility] Glean Hess, MD   11 months ago Annual physical exam   [facility] Glean Hess, MD   1 year ago Essential (primary) hypertension   [facility], [facility]   4 years ago Annual physical exam   [facility] Glean [facility], MD      Future Appointments            In 1 month Army Melia Jesse Sans, MD [facility]           . metFORMIN (GLUCOPHAGE-XR) 500 MG 24 hr tablet [Pharmacy Med Name: METFORMIN HCL ER 500 MG TAB] 180 tablet 0    Sig: TAKE 1 TABLET BY MOUTH TWICE DAILY     Endocrinology:  Diabetes - Biguanides Failed - 01/12/2021 10:59 AM      Failed - HBA1C is between 0  and 7.9 and within 180 days    Hgb A1c MFr Bld  Date Value Ref Range Status  10/07/2020 8.2 (H) 4.8 - 5.6 % Final    Comment:             Prediabetes: 5.7 - 6.4          Diabetes: >6.4          Glycemic control for adults with diabetes: <7.0          Passed - Cr in normal range and within 360 days    Creatinine, Ser  Date Value Ref Range Status  02/13/2020 1.10 0.76 - 1.27 mg/dL Final         Passed - eGFR in normal range and within 360 days    GFR calc Af Amer  Date Value Ref Range Status  02/13/2020 86 >59 mL/min/1.73 Final    Comment:    **In accordance with recommendations from the NKF-ASN Task  force,**   [facility] is in the process of updating its eGFR calculation to the   2021 CKD-EPI creatinine equation that estimates kidney function   without a race variable.    GFR calc non Af Amer  Date Value Ref Range Status  02/13/2020 75 >59 mL/min/1.73 Final         Passed - Valid encounter within last 6 months    Recent Outpatient Visits          3 months ago Type II diabetes mellitus with complication [facility])   [facility], [facility]   7 months ago Type II diabetes mellitus with complication [facility])   [facility], [facility]   11 months ago Annual physical exam   [facility] Glean Hess, MD   1 year ago Essential (primary) hypertension   [facility] Glean Hess, MD   4 years ago Annual physical exam   [facility] Glean [facility], [facility]      Future Appointments            In 1 month Army Melia Jesse Sans, MD [facility] [place]           . Testosterone 1.62 % GEL [Pharmacy Med Name: TESTOSTERONE 1.62% GEL PUMP] 176 g 0    Sig: APPLY 4 PUMPS TOPICALLY DAILY AS DIRECTED.     Off-Protocol Failed - 01/12/2021 10:59 AM      Failed - Medication not assigned to a protocol, review manually.      Passed - Valid encounter within last 12 months    Recent Outpatient Visits          3 months ago Type II diabetes mellitus with complication [facility])   [facility] Glean [facility], MD   7 months ago Type II diabetes mellitus with complication [facility])   [facility] Glean [facility], MD   11 months ago Annual physical exam   [facility] Glean Hess, MD   1 year ago Essential (primary) hypertension   [facility] Glean Hess, MD   4 years ago Annual physical exam   [facility] Gle[facility], MD      Future Appointments            In 1 month Army Melia Jesse Sans, MD [facility]

## 2021-02-18 ENCOUNTER — Encounter: Payer: BLUE CROSS/BLUE SHIELD | Admitting: Internal Medicine

## 2021-02-18 NOTE — Progress Notes (Deleted)
Date:  02/18/2021   Name:  Benjamin Nelson   DOB:  1965-11-26   MRN:  [id number]   Chief Complaint: No chief complaint on file. Benjamin Nelson is a 55 y.o. male who presents today for his Complete Annual Exam. He feels {DESC; WELL/FAIRLY WELL/POORLY:18703}. He reports exercising ***. He reports he is sleeping {DESC; WELL/FAIRLY WELL/POORLY:18703}.   Colonoscopy: 04/2014 repeat due 2021  Immunization History  Administered Date(s) Administered   Influenza,inj,Quad PF,6+ Mos 12/30/2014, 02/13/2020   Janssen (J&J) SARS-COV-2 Vaccination 07/13/2019   PPD Test 10/25/2015   Pneumococcal Polysaccharide-23 09/21/2015   Tdap 02/10/2013    Lab Results  Component Value Date   PSA1 0.8 02/13/2020   PSA1 1.1 10/02/2016   PSA1 0.7 05/04/2015   PSA 1.0 08/05/2014     Hypertension This is a chronic problem. The problem is controlled. Past treatments include ACE inhibitors.  Diabetes He presents for his follow-up diabetic visit. He has type 2 diabetes mellitus. His disease course has been worsening. Current diabetic treatment includes oral agent (dual therapy) (metformin and farxiga added last visit). An ACE inhibitor/angiotensin II receptor blocker is being taken. Eye exam is not current.  Hyperlipidemia This is a chronic problem. The problem is controlled. Current antihyperlipidemic treatment includes statins.   Lab Results  Component Value Date   NA 140 02/13/2020   K 4.4 02/13/2020   CO2 22 02/13/2020   GLUCOSE 139 (H) 02/13/2020   BUN 15 02/13/2020   CREATININE 1.10 02/13/2020   CALCIUM 9.6 02/13/2020   GFRNONAA 75 02/13/2020   Lab Results  Component Value Date   CHOL 171 02/13/2020   HDL 41 02/13/2020   LDLCALC 112 (H) 02/13/2020   TRIG 97 02/13/2020   CHOLHDL 4.2 02/13/2020   No results found for: TSH Lab Results  Component Value Date   HGBA1C 8.2 (H) 10/07/2020   Lab Results  Component Value Date   WBC 9.1 10/07/2020   HGB 17.3 10/07/2020   HCT 51.0 10/07/2020   MCV  93 10/07/2020   PLT 166 10/07/2020   Lab Results  Component Value Date   ALT 34 10/07/2020   AST 24 10/07/2020   ALKPHOS 113 10/07/2020   BILITOT 0.6 10/07/2020   No results found for: 25OHVITD2, 25OHVITD3, VD25OH   Review of Systems  Patient Active Problem List   Diagnosis Date Noted   Erectile dysfunction 06/10/2020   OSA (obstructive sleep apnea) 04/24/2016   Environmental and seasonal allergies 09/21/2015   Type II diabetes mellitus with complication ([place]) 98/33/8250   Hyperlipidemia associated with type 2 diabetes mellitus ([place]) 12/30/2014   Essential (primary) hypertension 12/29/2014   Decreased libido 12/29/2014   Decreased testosterone level 12/29/2014   Hx of colonic polyps - sessile serrated 04/30/2014    Allergies  Allergen Reactions   Benicar Hct [Olmesartan Medoxomil-Hctz] Hives and Swelling    Past Surgical History:  Procedure Laterality Date   CHOLECYSTECTOMY, LAPAROSCOPIC  07/04/2016   [place]   COLONOSCOPY  [id number]   2 sessile serrated polyps   KNEE ARTHROSCOPY Left 2004   left torn meniscus   TONSILLECTOMY  1971    Social History   Tobacco Use   Smoking status: Every Day    Packs/day: 0.50    Years: 3.00    Pack years: 1.50    Types: Cigarettes    Start date: 04/13/2017   Smokeless tobacco: Never  Vaping Use   Vaping Use: Never used  Substance Use Topics   Alcohol  use: Yes    Alcohol/week: 3.0 standard drinks    Types: 3 Glasses of wine per week    Comment: occasional   Drug use: No     Medication list has been reviewed and updated.  No outpatient medications have been marked as taking for the 02/18/21 encounter (Appointment) with Glean Hess, MD.    [facility] 2/9 Scores 10/07/2020 06/10/2020 02/13/2020 10/23/2019  PHQ - 2 Score 0 0 0 0  PHQ- 9 Score 0 0 0 0    GAD 7 : Generalized Anxiety Score 10/07/2020 06/10/2020 02/13/2020 10/23/2019  Nervous, Anxious, on Edge 0 0 0 0  Control/stop worrying 0 0 0 0  Worry too much - different  things 0 0 0 0  Trouble relaxing 0 0 0 0  Restless 0 0 0 0  Easily annoyed or irritable 0 0 0 0  Afraid - awful might happen 0 0 0 0  Total GAD 7 Score 0 0 0 0  Anxiety Difficulty - Not difficult at all - Not difficult at all    BP Readings from Last 3 Encounters:  10/07/20 118/80  06/10/20 118/76  02/13/20 118/82    Physical Exam  Wt Readings from Last 3 Encounters:  10/07/20 221 lb (100.2 kg)  06/10/20 228 lb (103.4 kg)  02/13/20 226 lb (102.5 kg)    There were no vitals taken for this visit.  Assessment and Plan:

## 2023-02-21 ENCOUNTER — Ambulatory Visit: Payer: No Typology Code available for payment source | Admitting: Cardiology

## 2023-04-06 ENCOUNTER — Telehealth: Payer: Self-pay

## 2023-04-06 NOTE — Transitions of Care (Post Inpatient/ED Visit) (Unsigned)
   04/06/2023  Name: Benjamin Nelson MRN: [id number] DOB: 08-19-1965  Today's TOC FU Call Status: Today's TOC FU Call Status:: Unsuccessful Call (1st Attempt) Unsuccessful Call (1st Attempt) Date: 04/06/23  Attempted to reach the patient regarding the most recent Inpatient/ED visit.  Follow Up Plan: Additional outreach attempts will be made to reach the patient to complete the Transitions of Care (Post Inpatient/ED visit) call.   Signature Karena Addison, LPN [facility] Nurse Health Advisor Direct Dial [phone]

## 2023-04-10 NOTE — Transitions of Care (Post Inpatient/ED Visit) (Signed)
   04/10/2023  Name: Benjamin Nelson MRN: [id number] DOB: 09-25-1965  Today's TOC FU Call Status: Today's TOC FU Call Status:: Unsuccessful Call (2nd Attempt) Unsuccessful Call (1st Attempt) Date: 04/06/23 Unsuccessful Call (2nd Attempt) Date: 04/10/23  Attempted to reach the patient regarding the most recent Inpatient/ED visit.  Follow Up Plan: Additional outreach attempts will be made to reach the patient to complete the Transitions of Care (Post Inpatient/ED visit) call.   Signature Karena Addison, LPN [facility] Nurse Health Advisor Direct Dial [phone]

## 2023-04-10 NOTE — Transitions of Care (Post Inpatient/ED Visit) (Signed)
   04/10/2023  Name: LAVERN MASLOW MRN: [id number] DOB: 1965-06-16  Today's TOC FU Call Status: Today's TOC FU Call Status:: Unsuccessful Call (3rd Attempt) Unsuccessful Call (1st Attempt) Date: 04/06/23 Unsuccessful Call (2nd Attempt) Date: 04/10/23 Unsuccessful Call (3rd Attempt) Date: 04/10/23  Attempted to reach the patient regarding the most recent Inpatient/ED visit.  Follow Up Plan: No further outreach attempts will be made at this time. We have been unable to contact the patient.  Signature Karena Addison, LPN [facility] Nurse Health Advisor Direct Dial [phone]
